# Patient Record
Sex: Male | Born: 1985 | Race: White | Hispanic: No | Marital: Single | Smoking: Never smoker
Health system: Southern US, Community
[De-identification: ages and names within clinical notes are randomized; demographics above are authoritative.]

## PROBLEM LIST (undated history)

## (undated) HISTORY — PX: APPENDECTOMY: SHX54

---

## 2014-08-08 ENCOUNTER — Emergency Department
Admission: EM | Admit: 2014-08-08 | Discharge: 2014-08-08 | Disposition: A | Discharge status: Home / Self Care | Payer: Self-pay | Attending: Emergency Medicine | Admitting: Emergency Medicine

## 2014-08-08 ENCOUNTER — Emergency Department: Payer: Self-pay

## 2014-08-08 ENCOUNTER — Other Ambulatory Visit: Payer: Self-pay

## 2014-08-08 ENCOUNTER — Encounter: Payer: Self-pay | Admitting: Emergency Medicine

## 2014-08-08 DIAGNOSIS — K5900 Constipation, unspecified: Secondary | ICD-10-CM | POA: Insufficient documentation

## 2014-08-08 DIAGNOSIS — R109 Unspecified abdominal pain: Secondary | ICD-10-CM

## 2014-08-08 LAB — COMPREHENSIVE METABOLIC PANEL
ALBUMIN: 3.8 g/dL (ref 3.5–5.0)
ALK PHOS: 78 U/L (ref 38–126)
ALT: 15 U/L — AB (ref 17–63)
ANION GAP: 11 (ref 5–15)
AST: 14 U/L — ABNORMAL LOW (ref 15–41)
BUN: 11 mg/dL (ref 6–20)
CHLORIDE: 98 mmol/L — AB (ref 101–111)
CO2: 25 mmol/L (ref 22–32)
Calcium: 9.1 mg/dL (ref 8.9–10.3)
Creatinine, Ser: 1.04 mg/dL (ref 0.61–1.24)
GFR calc non Af Amer: >60 mL/min (ref >60)
GLUCOSE: 91 mg/dL (ref 65–99)
POTASSIUM: 3.7 mmol/L (ref 3.5–5.1)
SODIUM: 134 mmol/L — AB (ref 135–145)
Total Bilirubin: 0.8 mg/dL (ref 0.3–1.2)
Total Protein: 8.3 g/dL — ABNORMAL HIGH (ref 6.5–8.1)

## 2014-08-08 LAB — CBC WITH DIFFERENTIAL/PLATELET
Basophils Absolute: 0.1 10*3/uL (ref 0–0.1)
Basophils Relative: 1 %
EOS ABS: 0.1 10*3/uL (ref 0–0.7)
Eosinophils Relative: 1 %
HEMATOCRIT: 37.1 % — AB (ref 40.0–52.0)
Hemoglobin: 12.1 g/dL — ABNORMAL LOW (ref 13.0–18.0)
Lymphocytes Relative: 11 %
Lymphs Abs: 1.2 10*3/uL (ref 1.0–3.6)
MCH: 23.9 pg — ABNORMAL LOW (ref 26.0–34.0)
MCHC: 32.6 g/dL (ref 32.0–36.0)
MCV: 73.3 fL — ABNORMAL LOW (ref 80.0–100.0)
Monocytes Absolute: 1.1 10*3/uL — ABNORMAL HIGH (ref 0.2–1.0)
Monocytes Relative: 10 %
NEUTROS PCT: 77 %
Neutro Abs: 8.4 10*3/uL — ABNORMAL HIGH (ref 1.4–6.5)
Platelets: 300 10*3/uL (ref 150–440)
RBC: 5.07 MIL/uL (ref 4.40–5.90)
RDW: 16.3 % — AB (ref 11.5–14.5)
WBC: 10.9 10*3/uL — AB (ref 3.8–10.6)

## 2014-08-08 LAB — URINALYSIS COMPLETE WITH MICROSCOPIC (ARMC ONLY)
BILIRUBIN URINE: NEGATIVE
Bacteria, UA: NONE SEEN
GLUCOSE, UA: NEGATIVE mg/dL
HGB URINE DIPSTICK: NEGATIVE
LEUKOCYTES UA: NEGATIVE
Nitrite: NEGATIVE
PROTEIN: 30 mg/dL — AB
SPECIFIC GRAVITY, URINE: 1.028 (ref 1.005–1.030)
SQUAMOUS EPITHELIAL / LPF: NONE SEEN
pH: 5 (ref 5.0–8.0)

## 2014-08-08 LAB — LIPASE, BLOOD: LIPASE: 12 U/L — AB (ref 22–51)

## 2014-08-08 NOTE — ED Notes (Signed)
Patient reports RLQ abdominal pain. Patient appears well.

## 2014-08-08 NOTE — Discharge Instructions (Signed)
No certain cause for your abdominal pain was found tonight, however your exam and evaluation are reassuring. We discussed the possibility of doing a CT scan tonight and decided to hold off on this study due to the radiation risk versus benefit. We discussed your x-ray shows a significant amount of stool and your pains may be related to constipation. I recommend taking over-the-counter MiraLAX 17 g in 8 ounces of water once to twice per day for the next few days. You may also try over-the-counter fleets enema.  Return to the emergency department for any new or worsening condition including fever, worsening abdominal pain, inability to urinate, vomiting, black or bloody stools, or any other symptoms concerning to you.   Abdominal Pain Many things can cause belly (abdominal) pain. Most times, the belly pain is not dangerous. Many cases of belly pain can be watched and treated at home. HOME CARE   Do not take medicines that help you go poop (laxatives) unless told to by your doctor.  Only take medicine as told by your doctor.  Eat or drink as told by your doctor. Your doctor will tell you if you should be on a special diet. GET HELP IF:  You do not know what is causing your belly pain.  You have belly pain while you are sick to your stomach (nauseous) or have runny poop (diarrhea).  You have pain while you pee or poop.  Your belly pain wakes you up at night.  You have belly pain that gets worse or better when you eat.  You have belly pain that gets worse when you eat fatty foods.  You have a fever. GET HELP RIGHT AWAY IF:   The pain does not go away within 2 hours.  You keep throwing up (vomiting).  The pain changes and is only in the right or left part of the belly.  You have bloody or tarry looking poop. MAKE SURE YOU:   Understand these instructions.  Will watch your condition.  Will get help right away if you are not doing well or get worse. Document Released:  06/16/2007 Document Revised: 01/02/2013 Document Reviewed: 09/06/2012 [facility] Patient Information 2015 [place], [place]. This information is not intended to replace advice given to you by your health care provider. Make sure you discuss any questions you have with your health care provider.  Constipation Constipation is when a person has fewer than three bowel movements a week, has difficulty having a bowel movement, or has stools that are dry, hard, or larger than normal. As people grow older, constipation is more common. If you try to fix constipation with medicines that make you have a bowel movement (laxatives), the problem may get worse. Long-term laxative use may cause the muscles of the colon to become weak. A low-fiber diet, not taking in enough fluids, and taking certain medicines may make constipation worse.  CAUSES   Certain medicines, such as antidepressants, pain medicine, iron supplements, antacids, and water pills.   Certain diseases, such as diabetes, irritable bowel syndrome (IBS), thyroid disease, or depression.   Not drinking enough water.   Not eating enough fiber-rich foods.   Stress or travel.   Lack of physical activity or exercise.   Ignoring the urge to have a bowel movement.   Using laxatives too much.  SIGNS AND SYMPTOMS   Having fewer than three bowel movements a week.   Straining to have a bowel movement.   Having stools that are hard, dry, or larger  than normal.   Feeling full or bloated.   Pain in the lower abdomen.   Not feeling relief after having a bowel movement.  DIAGNOSIS  Your health care provider will take a medical history and perform a physical exam. Further testing may be done for severe constipation. Some tests may include:  A barium enema X-ray to examine your rectum, colon, and, sometimes, your small intestine.   A sigmoidoscopy to examine your lower colon.   A colonoscopy to examine your entire colon. TREATMENT    Treatment will depend on the severity of your constipation and what is causing it. Some dietary treatments include drinking more fluids and eating more fiber-rich foods. Lifestyle treatments may include regular exercise. If these diet and lifestyle recommendations do not help, your health care provider may recommend taking over-the-counter laxative medicines to help you have bowel movements. Prescription medicines may be prescribed if over-the-counter medicines do not work.  HOME CARE INSTRUCTIONS   Eat foods that have a lot of fiber, such as fruits, vegetables, whole grains, and beans.  Limit foods high in fat and processed sugars, such as french fries, hamburgers, cookies, candies, and soda.   A fiber supplement may be added to your diet if you cannot get enough fiber from foods.   Drink enough fluids to keep your urine clear or pale yellow.   Exercise regularly or as directed by your health care provider.   Go to the restroom when you have the urge to go. Do not hold it.   Only take over-the-counter or prescription medicines as directed by your health care provider. Do not take other medicines for constipation without talking to your health care provider first.  [place] IF:   You have bright red blood in your stool.   Your constipation lasts for more than 4 days or gets worse.   You have abdominal or rectal pain.   You have thin, pencil-like stools.   You have unexplained weight loss. MAKE SURE YOU:   Understand these instructions.  Will watch your condition.  Will get help right away if you are not doing well or get worse. Document Released: 09/26/2003 Document Revised: 01/02/2013 Document Reviewed: 10/09/2012 [facility] Patient Information 2015 [place], [place]. This information is not intended to replace advice given to you by your health care provider. Make sure you discuss any questions you have with your health care provider.

## 2014-08-08 NOTE — ED Notes (Signed)
Pt presents to ER alert and in NAD. Pt reports RLQ pain for 2 weeks. Pt denies n/v/d. Pt has not seen PCP for issue.

## 2014-08-08 NOTE — ED Provider Notes (Signed)
[facility] Emergency Department Provider Note   ____________________________________________  Time seen: 8:45 PM I have reviewed the triage vital signs and the triage nursing note.  HISTORY  Chief Complaint Abdominal Pain   Historian Patient and girlfriend  HPI Tony Odonnell is a 29 y.o. male please been having intermittent, waxing and waning right lower quadrant tenderness for about 2 weeks. At times is gone at times it severe and currently it is mild. No trauma or overuse that he knows of. No urinary symptoms. He does have a history of constipation. He's had episodes of diarrhea also. He has a family history of his father having Crohn's disease. This diagnosis has been questioned in the past, however he has not been evaluated with a colonoscopy by a GI evaluation yet. No fever.Prior appendectomy.    History reviewed. No pertinent past medical history.  There are no active problems to display for this patient.   Past Surgical History  Procedure Laterality Date  . Appendectomy      No current outpatient prescriptions on file.  Allergies Review of patient's allergies indicates no known allergies.  History reviewed. No pertinent family history.  Social History History  Substance Use Topics  . Smoking status: Never Smoker   . Smokeless tobacco: Not on file  . Alcohol Use: No    Review of Systems  Constitutional: Negative for fever. Eyes: Negative for visual changes. ENT: Negative for sore throat. Cardiovascular: Negative for chest pain. Respiratory: Negative for shortness of breath. Gastrointestinal: Negative for nausea or vomiting.. Genitourinary: Negative for dysuria. Musculoskeletal: Negative for back pain. Skin: Negative for rash. Neurological: Negative for headaches, focal weakness or numbness. 10 point Review of Systems otherwise negative ____________________________________________   PHYSICAL EXAM:  VITAL SIGNS: ED  Triage Vitals  Enc Vitals Group     BP 08/08/14 1925 145/92 mmHg     Pulse Rate 08/08/14 1925 109     Resp 08/08/14 1925 20     Temp 08/08/14 1925 98.5 F (36.9 C)     Temp Source 08/08/14 1925 Oral     SpO2 08/08/14 1925 99 %     Weight 08/08/14 1925 245 lb 14.4 oz (111.54 kg)     Height 08/08/14 1925 6\' 1"  (1.854 m)     Head Cir --      Peak Flow --      Pain Score 08/08/14 1926 3     Pain Loc --      Pain Edu? --      Excl. in [place]? --      Constitutional: Alert and oriented. Well appearing and in no distress. Eyes: Conjunctivae are normal. PERRL. Normal extraocular movements. ENT   Head: Normocephalic and atraumatic.   Nose: No congestion/rhinnorhea.   Mouth/Throat: Mucous membranes are moist.   Neck: No stridor. Cardiovascular/Chest: Normal rate, regular rhythm.  No murmurs, rubs, or gallops. Respiratory: Normal respiratory effort without tachypnea nor retractions. Breath sounds are clear and equal bilaterally. No wheezes/rales/rhonchi. Gastrointestinal: Soft. No distention, no guarding, no rebound. Moderate tenderness in the right-sided abdomen lower greater than upper. No focal right upper quadrant tenderness. No left sided tenderness.  Genitourinary/rectal:Deferred Musculoskeletal: Nontender with normal range of motion in all extremities. No joint effusions.  No lower extremity tenderness nor edema. Neurologic:  Normal speech and language. No gross or focal neurologic deficits are appreciated. Skin:  Skin is warm, dry and intact. No rash noted. Psychiatric: Mood and affect are normal. Speech and behavior are normal. Patient exhibits appropriate  insight and judgment.  ____________________________________________   EKG I, Lisa Roca, MD, the attending physician have personally viewed and interpreted all ECGs.  Sinus tachycardia. 108 bpm. Narrow QRS. Normal axis. Normal ST and T-wave. ____________________________________________  LABS (pertinent  positives/negatives)  Metabolic panel significant for sodium 134, chloride 98. LFTs within normal limits White blood cell count 10.9, hemoglobin 12.1 Lipase 12  ____________________________________________  RADIOLOGY All Xrays were viewed by me. Imaging interpreted by Radiologist.  Abdomen x-ray: Moderate: Large stool burden, and no air-fluid levels.  IMPRESSION: Question right ureterolithiasis, with a triangular 4 mm calcification near the right L4 transverse process.  Renal ultrasound: Normal renal ultrasound  __________________________________________  PROCEDURES  Procedure(s) performed: None Critical Care performed: None  ____________________________________________   ED COURSE / ASSESSMENT AND PLAN  CONSULTATIONS: None  Pertinent labs & imaging results that were available during my care of the patient were reviewed by me and considered in my medical decision making (see chart for details).   Patient's symptoms are in the right lower quadrant, he has already had an appendectomy. Symptoms have been waxing and waning and considering the possibility for kidney stone and I'm awaiting urinalysis and renal ultrasound this point in time. I discussed risk and benefit of CT scan with the patient and he wants to wait to make this decision until after an x-ray and his renal ultrasound.  We discussed his x-ray shows a significant stool burden without air-fluid levels or signs of obstruction. We discussed possible stones seen on x-ray, however it was 4 mm and would likely pass on its own, and I feel kidney stone is unlikely given the fact that the pain has been there for a couple weeks and the size is so small and the patient has had no urinary symptoms, and there is no blood in the urine, and ultrasound of the kidneys are normal.  I discussed with the patient the consideration of doing a CT scan given the right-sided abdominal pain and the elevated white blood cell count, however he  is hesitant to do this, and I think that is reasonable to treat possible constipation. The patient is reasonable and understands follow-up precautions.  Patient / Family / Caregiver informed of clinical course, medical decision-making process, and agree with plan.   I discussed return precautions, follow-up instructions, and discharged instructions with patient and/or family.  ___________________________________________   FINAL CLINICAL IMPRESSION(S) / ED DIAGNOSES   Final diagnoses:  Right sided abdominal pain  Constipation, unspecified constipation type    FOLLOW UP  Referred to: Primary care physician and gastroenterology   Lisa Roca, MD 08/08/14 2151

## 2014-08-24 ENCOUNTER — Emergency Department: Payer: Self-pay

## 2014-08-24 ENCOUNTER — Encounter: Payer: Self-pay | Admitting: Emergency Medicine

## 2014-08-24 ENCOUNTER — Inpatient Hospital Stay
Admission: EM | Admit: 2014-08-24 | Discharge: 2014-08-29 | DRG: 386 | Disposition: A | Discharge status: Home / Self Care | Payer: Self-pay | Attending: Specialist | Admitting: Specialist

## 2014-08-24 DIAGNOSIS — D72829 Elevated white blood cell count, unspecified: Secondary | ICD-10-CM | POA: Diagnosis present

## 2014-08-24 DIAGNOSIS — K5900 Constipation, unspecified: Secondary | ICD-10-CM | POA: Diagnosis present

## 2014-08-24 DIAGNOSIS — K632 Fistula of intestine: Secondary | ICD-10-CM | POA: Diagnosis present

## 2014-08-24 DIAGNOSIS — R109 Unspecified abdominal pain: Secondary | ICD-10-CM

## 2014-08-24 DIAGNOSIS — E871 Hypo-osmolality and hyponatremia: Secondary | ICD-10-CM | POA: Diagnosis present

## 2014-08-24 DIAGNOSIS — D638 Anemia in other chronic diseases classified elsewhere: Secondary | ICD-10-CM | POA: Diagnosis present

## 2014-08-24 DIAGNOSIS — Z9049 Acquired absence of other specified parts of digestive tract: Secondary | ICD-10-CM | POA: Diagnosis present

## 2014-08-24 DIAGNOSIS — D509 Iron deficiency anemia, unspecified: Secondary | ICD-10-CM | POA: Diagnosis present

## 2014-08-24 DIAGNOSIS — K630 Abscess of intestine: Secondary | ICD-10-CM | POA: Diagnosis present

## 2014-08-24 DIAGNOSIS — K50813 Crohn's disease of both small and large intestine with fistula: Principal | ICD-10-CM | POA: Insufficient documentation

## 2014-08-24 DIAGNOSIS — E860 Dehydration: Secondary | ICD-10-CM | POA: Diagnosis present

## 2014-08-24 LAB — COMPREHENSIVE METABOLIC PANEL
ALT: 22 U/L (ref 17–63)
AST: 17 U/L (ref 15–41)
Albumin: 3.7 g/dL (ref 3.5–5.0)
Alkaline Phosphatase: 115 U/L (ref 38–126)
Anion gap: 9 (ref 5–15)
BILIRUBIN TOTAL: 0.8 mg/dL (ref 0.3–1.2)
BUN: 11 mg/dL (ref 6–20)
CALCIUM: 9.3 mg/dL (ref 8.9–10.3)
CHLORIDE: 103 mmol/L (ref 101–111)
CO2: 26 mmol/L (ref 22–32)
Creatinine, Ser: 0.98 mg/dL (ref 0.61–1.24)
GFR calc Af Amer: >60 mL/min (ref >60)
GFR calc non Af Amer: >60 mL/min (ref >60)
Glucose, Bld: 100 mg/dL — ABNORMAL HIGH (ref 65–99)
Potassium: 4.2 mmol/L (ref 3.5–5.1)
SODIUM: 138 mmol/L (ref 135–145)
TOTAL PROTEIN: 8.4 g/dL — AB (ref 6.5–8.1)

## 2014-08-24 LAB — CBC
HEMATOCRIT: 39.5 % — AB (ref 40.0–52.0)
Hemoglobin: 12.2 g/dL — ABNORMAL LOW (ref 13.0–18.0)
MCH: 22.8 pg — ABNORMAL LOW (ref 26.0–34.0)
MCHC: 31 g/dL — AB (ref 32.0–36.0)
MCV: 73.5 fL — AB (ref 80.0–100.0)
PLATELETS: 419 10*3/uL (ref 150–440)
RBC: 5.37 MIL/uL (ref 4.40–5.90)
RDW: 16.7 % — AB (ref 11.5–14.5)
WBC: 9.4 10*3/uL (ref 3.8–10.6)

## 2014-08-24 LAB — URINALYSIS COMPLETE WITH MICROSCOPIC (ARMC ONLY)
Bilirubin Urine: NEGATIVE
Glucose, UA: NEGATIVE mg/dL
Hgb urine dipstick: NEGATIVE
LEUKOCYTES UA: NEGATIVE
NITRITE: NEGATIVE
PROTEIN: 30 mg/dL — AB
Specific Gravity, Urine: 1.031 — ABNORMAL HIGH (ref 1.005–1.030)
pH: 5 (ref 5.0–8.0)

## 2014-08-24 MED ORDER — ONDANSETRON HCL 4 MG/2ML IJ SOLN
4.0000 mg | Freq: Once | INTRAMUSCULAR | Status: AC
Start: 2014-08-24 — End: 2014-08-24
  Administered 2014-08-24: 4 mg via INTRAVENOUS
  Filled 2014-08-24: qty 2

## 2014-08-24 MED ORDER — SODIUM CHLORIDE 0.9 % IV BOLUS (SEPSIS)
1000.0000 mL | Freq: Once | INTRAVENOUS | Status: AC
  Administered 2014-08-24: 1000 mL via INTRAVENOUS

## 2014-08-24 MED ORDER — PIPERACILLIN-TAZOBACTAM 3.375 G IVPB
3.3750 g | Freq: Once | INTRAVENOUS | Status: AC
  Administered 2014-08-24: 3.375 g via INTRAVENOUS
  Filled 2014-08-24: qty 50

## 2014-08-24 MED ORDER — ACETAMINOPHEN 325 MG PO TABS: 650.0000 mg | ORAL_TABLET | Freq: Four times a day (QID) | ORAL | Status: DC | PRN

## 2014-08-24 MED ORDER — PIPERACILLIN-TAZOBACTAM 3.375 G IVPB
3.3750 g | Freq: Three times a day (TID) | INTRAVENOUS | Status: DC
  Administered 2014-08-25 – 2014-08-29 (×14): 3.375 g via INTRAVENOUS
  Filled 2014-08-24 (×17): qty 50

## 2014-08-24 MED ORDER — IOHEXOL 240 MG/ML SOLN
25.0000 mL | Freq: Once | INTRAMUSCULAR | Status: AC | PRN
  Administered 2014-08-24: 25 mL via ORAL

## 2014-08-24 MED ORDER — SODIUM CHLORIDE 0.9 % IV SOLN
INTRAVENOUS | Status: DC
  Administered 2014-08-24 – 2014-08-29 (×8): via INTRAVENOUS

## 2014-08-24 MED ORDER — HYDROMORPHONE HCL 1 MG/ML IJ SOLN
1.0000 mg | INTRAMUSCULAR | Status: DC | PRN
  Administered 2014-08-24 – 2014-08-29 (×13): 1 mg via INTRAVENOUS
  Filled 2014-08-24 (×13): qty 1

## 2014-08-24 MED ORDER — IOHEXOL 350 MG/ML SOLN
100.0000 mL | Freq: Once | INTRAVENOUS | Status: AC | PRN
  Administered 2014-08-24: 100 mL via INTRAVENOUS

## 2014-08-24 MED ORDER — PIPERACILLIN SOD-TAZOBACTAM SO 3.375 (3-0.375) G IV SOLR
INTRAVENOUS | Status: AC
  Filled 2014-08-24: qty 3.38

## 2014-08-24 MED ORDER — HYDROMORPHONE HCL 1 MG/ML IJ SOLN
1.0000 mg | Freq: Once | INTRAMUSCULAR | Status: AC
  Administered 2014-08-24: 1 mg via INTRAVENOUS
  Filled 2014-08-24: qty 1

## 2014-08-24 MED ORDER — ACETAMINOPHEN 650 MG RE SUPP: 650.0000 mg | Freq: Four times a day (QID) | RECTAL | Status: DC | PRN

## 2014-08-24 NOTE — ED Provider Notes (Addendum)
[facility]  I accepted care from Dr. Corky Downs at shift change 3 PM   ____________________________________________    RADIOLOGY All xrays were viewed by me. Imaging interpreted by radiologist. I discussed the CT results with the radiologist  CT abdomen pelvis with contrast:  Stomach/Bowel: The appearance of the stomach is normal. No pathologic dilatation of small bowel or colon. Status post appendectomy. In the right side of the abdomen there are multiple loops of bowel which appear intimately tethered to one another, with apparent inter- communications between loops of small bowel and adjacent loops of both small bowel and colon (i.e., both enteroenteric fistulae and enterocolonic fistulae). 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 In the more proximal ileum, best appreciated on image 60 of series 2 there is profound mural thickening. There appear to be a multiple small extra luminal collections of fluid which are rim enhancing, with extensive surrounding inflammation, and extension to the overlying abdominal wall musculature in the right side of the abdomen anterior to the ascending colon, concerning for combination of phlegmon and multiple tiny abscesses. On coronal images (image 61 of series 5), the largest of these small rim enhancing fluid collections measures approximately 1.3 x 1.9 cm and appears to be extending into the overlying transversus abdominis Musculature.   IMPRESSION: 1. Spectrum of findings in the right side of the abdomen, as detailed above, which almost certainly represents advanced Crohn's disease. 2. Status post appendectomy.  ____________________________________________   PROCEDURES  Procedure(s) performed: None  Critical Care performed: None  ____________________________________________   INITIAL  IMPRESSION / ASSESSMENT AND PLAN / ED COURSE  CONSULTATIONS: Phone consultation with gastroenterology Dr. Vira Agar. Face-to-face consultation with hospitalist for admission. Dr. Pat Patrick, general surgery face-to-face for consultation.  Pertinent labs & imaging results that were available during my care of the patient were reviewed by me and considered in my medical decision making (see chart for details).  I discussed CT scan findings showing advanced Crohn's disease with multiple fistulae and phlegmon with the radiologist and then with the patient.. Discussed the case with Dr. Vira Agar with GI. Patient will initiate treatment here in the hospital during admission. Patient to be admitted by the hospitalist.  ----------------------------------------- 5:35 PM on 08/24/2014 -----------------------------------------  Per hospitalist Dr. Bridgett Larsson request, I have discussed the case with Dr. Pat Patrick with general surgery, and requested consultation. Dr. Pat Patrick will see the patient prior to Hospital is admission. Suspect medical management with IV antibiotics initially as the patient has mild localized peritonitis, without any systemic symptoms of elevated white blood cell count, or fever.  ----------------------------------------- 6:59 PM on 08/24/2014 -----------------------------------------  Hospitalist Dr. Edwina Barth talked with Dr. Vira Agar of GI and decided patient would be treated medically first and given the fact that clinically he does not show fever, white blood cell count elevation, or diffuse peritonitis. Dr. Edwina Barth will admit to the hospitalist. Dr. Pat Patrick has not seen the patient yet in the ED.   Patient / Family / Caregiver informed of clinical course, medical decision-making process, and agree with plan.    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

## 2014-08-24 NOTE — ED Provider Notes (Signed)
[facility] Emergency Department Provider Note  ____________________________________________  Time seen: 1 PM  I have reviewed the triage vital signs and the nursing notes.   HISTORY  Chief Complaint Abdominal Pain    HPI Shepherd Tony Odonnell is a 29 y.o. male who presents with abdominal pain. He reports 2 weeks of right-sided abdominal pain to be intermittent. He was seen in our emergency department had an x-ray and ultrasound of his kidneys which showed no significant issues except constipation. He has taken stool softeners for the last 2 weeks and it is helped somewhat but the pain continues. He denies fevers chills. No nausea no vomiting.     History reviewed. No pertinent past medical history.  There are no active problems to display for this patient.   Past Surgical History  Procedure Laterality Date  . Appendectomy      No current outpatient prescriptions on file.  Allergies Other  No family history on file.  Social History Social History  Substance Use Topics  . Smoking status: Never Smoker   . Smokeless tobacco: None  . Alcohol Use: No    Review of Systems  Constitutional: Negative for fever. Eyes: Negative for visual changes. ENT: Negative for sore throat Cardiovascular: Negative for chest pain. Respiratory: Negative for shortness of breath. Gastrointestinal: Negative for  vomiting and diarrhea. Genitourinary: Negative for dysuria. Musculoskeletal: Negative for back pain. Skin: Negative for rash. Neurological: Negative for headaches  Psychiatric: No anxiety    ____________________________________________   PHYSICAL EXAM:  VITAL SIGNS: ED Triage Vitals  Enc Vitals Group     BP 08/24/14 1119 135/79 mmHg     Pulse Rate 08/24/14 1119 111     Resp 08/24/14 1119 18     Temp 08/24/14 1119 98.4 F (36.9 C)     Temp Source 08/24/14 1119 Oral     SpO2 08/24/14 1119 100 %     Weight 08/24/14 1119 245 lb (111.131 kg)   Height 08/24/14 1119 6\' 2"  (1.88 m)     Head Cir --      Peak Flow --      Pain Score 08/24/14 1120 6     Pain Loc --      Pain Edu? --      Excl. in [place]? --      Constitutional: Alert and oriented. Well appearing and in no distress. Eyes: Conjunctivae are normal.  ENT   Head: Normocephalic and atraumatic.   Mouth/Throat: Mucous membranes are moist. Cardiovascular: Normal rate, regular rhythm. Normal and symmetric distal pulses are present in all extremities. No murmurs, rubs, or gallops. Respiratory: Normal respiratory effort without tachypnea nor retractions. Breath sounds are clear and equal bilaterally.  Gastrointestinal: Significant right upper quadrant tenderness to palpation. Positive Murphy's sign. No distention. There is no CVA tenderness. Genitourinary: deferred Musculoskeletal: Nontender with normal range of motion in all extremities. No lower extremity tenderness nor edema. Neurologic:  Normal speech and language. No gross focal neurologic deficits are appreciated. Skin:  Skin is warm, dry and intact. No rash noted. Psychiatric: Mood and affect are normal. Patient exhibits appropriate insight and judgment.  ____________________________________________    LABS (pertinent positives/negatives)  Labs Reviewed  COMPREHENSIVE METABOLIC PANEL - Abnormal; Notable for the following:    Glucose, Bld 100 (*)    Total Protein 8.4 (*)    All other components within normal limits  CBC - Abnormal; Notable for the following:    Hemoglobin 12.2 (*)    HCT 39.5 (*)  MCV 73.5 (*)    MCH 22.8 (*)    MCHC 31.0 (*)    RDW 16.7 (*)    All other components within normal limits  URINALYSIS COMPLETEWITH MICROSCOPIC ([facility] ONLY) - Abnormal; Notable for the following:    Color, Urine AMBER (*)    APPearance CLEAR (*)    Ketones, ur TRACE (*)    Specific Gravity, Urine 1.031 (*)    Protein, ur 30 (*)    Bacteria, UA RARE (*)    Squamous Epithelial / LPF 0-5 (*)    All other  components within normal limits    ____________________________________________   EKG  None  ____________________________________________    RADIOLOGY I have personally reviewed any xrays that were ordered on this patient: Ultrasound right upper quadrant is normal.  ____________________________________________   PROCEDURES  Procedure(s) performed: none  Critical Care performed: none  ____________________________________________   INITIAL IMPRESSION / ASSESSMENT AND PLAN / ED COURSE  Pertinent labs & imaging results that were available during my care of the patient were reviewed by me and considered in my medical decision making (see chart for details).  Patient with significant right upper quadrant tenderness to palpation. I will obtain ultrasound of the gallbladder to rule out cholelithiasis. If the ultrasound is normal patient may require CT scan.  ----------------------------------------- 2:53 PM on 08/24/2014 -----------------------------------------  We will obtain CT abdomen and pelvis and I'll sign out the case to Dr. Reita Cliche. If normal CT patient will go home with follow-up with GI  ____________________________________________   FINAL CLINICAL IMPRESSION(S) / ED DIAGNOSES  Final diagnoses:  Abdominal pain     Lavonia Drafts, MD 08/24/14 1454

## 2014-08-24 NOTE — ED Notes (Signed)
Patient to ER for continued RLQ abdominal pain. Patient states he was here a few weeks ago for the same. Patient had ultrasound that was negative. Was told he had constipation and was given laxatives. States he was told if pain did not go away to come back to have CT. Patient

## 2014-08-24 NOTE — Consult Note (Signed)
Surgical Consultation  08/24/2014  Tony Odonnell is an 29 y.o. male.   CC: Right lower quadrant pain  HPI: This patient with several days of right lower quadrant pain it has been gradually worsening. He denies nausea vomiting fevers or chills and has had normal bowel movements without diarrhea melena or hematochezia.  Of note the patient had this happen at least once before. He was cared for at [facility] 7 years ago when he was approximately 29 years old and he had an appendectomy at that time because he states that his appendix possibly ruptured a year before his appendectomy. And in fact he had drainage of an abscess followed by an interval appendectomy and was told that he may have Crohn's disease at that time his father also has Crohn's disease which is managed well with medications and does not have an ileostomy. Patient has never been treated for Crohn's disease other than the appendectomy he's never been on medications for Crohn's disease and denies steroidal use. He has had no follow-up with a GI physician between his appendectomy and today. He was seen by Dr. Vira Agar for the first time today.  History reviewed. No pertinent past medical history.  Past Surgical History  Procedure Laterality Date  . Appendectomy      No family history on file.  Social History:  reports that he has never smoked. He does not have any smokeless tobacco history on file. He reports that he does not drink alcohol. His drug history is not on file.  Allergies:  Allergies  Allergen Reactions  . Other Anaphylaxis    Mushrooms    Medications reviewed.   Review of Systems:   Review of Systems  Constitutional: Negative.  Negative for fever, chills and weight loss.  HENT: Negative.   Eyes: Negative.   Respiratory: Negative.   Cardiovascular: Negative.   Gastrointestinal: Positive for abdominal pain. Negative for heartburn, nausea, vomiting, diarrhea, constipation, blood in stool and melena.   Genitourinary: Negative.   Musculoskeletal: Negative.   Neurological: Negative.   Endo/Heme/Allergies: Negative.   Psychiatric/Behavioral: Negative.      Physical Exam:  BP 116/77 mmHg  Pulse 95  Temp(Src) 98.4 F (36.9 C) (Oral)  Resp 18  Ht '6\' 2"'  (1.88 m)  Wt 245 lb (111.131 kg)  BMI 31.44 kg/m2  SpO2 98%  Physical Exam  Constitutional: He is oriented to person, place, and time and well-developed, well-nourished, and in no distress. No distress.  HENT:  Head: Normocephalic and atraumatic.  Eyes: Pupils are equal, round, and reactive to light. No scleral icterus.  Neck: Normal range of motion. Neck supple.  Cardiovascular: Normal rate, regular rhythm and normal heart sounds.   Pulmonary/Chest: Effort normal and breath sounds normal. No respiratory distress. He has no wheezes. He has no rales.  Abdominal: Soft. He exhibits no distension. There is tenderness. There is no rebound and no guarding.  Minimal right lower quadrant tenderness without peritoneal signs no guarding no rebound no percussion tenderness and no mass  Musculoskeletal: Normal range of motion.  Lymphadenopathy:    He has no cervical adenopathy.  Neurological: He is alert and oriented to person, place, and time.  Skin: Skin is warm and dry. No rash noted. No erythema.  Psychiatric: Mood, affect and judgment normal.      Results for orders placed or performed during the hospital encounter of 08/24/14 (from the past 48 hour(s))  Comprehensive metabolic panel     Status: Abnormal   Collection Time: 08/24/14 11:27  AM  Result Value Ref Range   Sodium 138 135 - 145 mmol/L   Potassium 4.2 3.5 - 5.1 mmol/L   Chloride 103 101 - 111 mmol/L   CO2 26 22 - 32 mmol/L   Glucose, Bld 100 (H) 65 - 99 mg/dL   BUN 11 6 - 20 mg/dL   Creatinine, Ser 0.98 0.61 - 1.24 mg/dL   Calcium 9.3 8.9 - 10.3 mg/dL   Total Protein 8.4 (H) 6.5 - 8.1 g/dL   Albumin 3.7 3.5 - 5.0 g/dL   AST 17 15 - 41 U/L   ALT 22 17 - 63 U/L    Alkaline Phosphatase 115 38 - 126 U/L   Total Bilirubin 0.8 0.3 - 1.2 mg/dL   GFR calc non Af Amer >60 >60 mL/min   GFR calc Af Amer >60 >60 mL/min    Comment: (NOTE) The eGFR has been calculated using the CKD EPI equation. This calculation has not been validated in all clinical situations. eGFR's persistently <60 mL/min signify possible Chronic Kidney Disease.    Anion gap 9 5 - 15  CBC     Status: Abnormal   Collection Time: 08/24/14 11:27 AM  Result Value Ref Range   WBC 9.4 3.8 - 10.6 K/uL   RBC 5.37 4.40 - 5.90 MIL/uL   Hemoglobin 12.2 (L) 13.0 - 18.0 g/dL   HCT 39.5 (L) 40.0 - 52.0 %   MCV 73.5 (L) 80.0 - 100.0 fL   MCH 22.8 (L) 26.0 - 34.0 pg   MCHC 31.0 (L) 32.0 - 36.0 g/dL   RDW 16.7 (H) 11.5 - 14.5 %   Platelets 419 150 - 440 K/uL  Urinalysis complete, with microscopic ([facility] only)     Status: Abnormal   Collection Time: 08/24/14 11:27 AM  Result Value Ref Range   Color, Urine AMBER (A) YELLOW   APPearance CLEAR (A) CLEAR   Glucose, UA NEGATIVE NEGATIVE mg/dL   Bilirubin Urine NEGATIVE NEGATIVE   Ketones, ur TRACE (A) NEGATIVE mg/dL   Specific Gravity, Urine 1.031 (H) 1.005 - 1.030   Hgb urine dipstick NEGATIVE NEGATIVE   pH 5.0 5.0 - 8.0   Protein, ur 30 (A) NEGATIVE mg/dL   Nitrite NEGATIVE NEGATIVE   Leukocytes, UA NEGATIVE NEGATIVE   RBC / HPF 0-5 0 - 5 RBC/hpf   WBC, UA 0-5 0 - 5 WBC/hpf   Bacteria, UA RARE (A) NONE SEEN   Squamous Epithelial / LPF 0-5 (A) NONE SEEN   Mucous PRESENT    Ct Abdomen Pelvis W Contrast  08/24/2014   CLINICAL DATA:  29 year old male with 2 week history of right-sided abdominal pain intermittently. History of prior appendectomy.  EXAM: CT ABDOMEN AND PELVIS WITH CONTRAST  TECHNIQUE: Multidetector CT imaging of the abdomen and pelvis was performed using the standard protocol following bolus administration of intravenous contrast.  CONTRAST:  179m OMNIPAQUE IOHEXOL 350 MG/ML SOLN  COMPARISON:  CT the abdomen and pelvis 07/13/2009.   FINDINGS: Lower chest:  Unremarkable.  Hepatobiliary: No cystic or solid hepatic lesions. No intra or extrahepatic biliary ductal dilatation. Gallbladder is normal in appearance.  Pancreas: No pancreatic mass. No pancreatic ductal dilatation. No pancreatic or peripancreatic fluid or inflammatory changes.  Spleen: Unremarkable.  Adrenals/Urinary Tract: Bilateral adrenal glands and the right kidney are normal in appearance. Sub cm low-attenuation lesion in the lower pole of the left kidney is too small to characterize, but statistically likely a small cyst. No hydroureteronephrosis. Urinary bladder is normal in appearance.  Stomach/Bowel: The appearance of the stomach is normal. No pathologic dilatation of small bowel or colon. Status post appendectomy. In the right side of the abdomen there are multiple loops of bowel which appear intimately tethered to one another, with apparent inter- communications between loops of small bowel and adjacent loops of both small bowel and colon (i.e., both enteroenteric fistulae and enterocolonic fistulae). 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 In the more proximal ileum, best appreciated on image 60 of series 2 there is profound mural thickening. There appear to be a multiple small extra luminal collections of fluid which are rim enhancing, with extensive surrounding inflammation, and extension to the overlying abdominal wall musculature in the right side of the abdomen anterior to the ascending colon, concerning for combination of phlegmon and multiple tiny abscesses. On coronal images (image 61 of series 5), the largest of these small rim enhancing fluid collections measures approximately 1.3 x 1.9 cm and appears to be extending into the overlying transversus abdominis musculature.  Vascular/Lymphatic: No significant atherosclerotic disease, aneurysm or  dissection identified in the abdominal or pelvic vasculature. Several borderline enlarged ileocolic lymph nodes are noted, presumably reactive.  Reproductive: Prostate gland and seminal vesicles are unremarkable in appearance.  Other: No significant volume of ascites.  No pneumoperitoneum.  Musculoskeletal: There are no aggressive appearing lytic or blastic lesions noted in the visualized portions of the skeleton.  IMPRESSION: 1. Spectrum of findings in the right side of the abdomen, as detailed above, which almost certainly represents advanced Crohn's disease. 2. Status post appendectomy. These results were called by telephone at the time of interpretation on 08/24/2014 at 4:06 pm to Dr. Reita Cliche, who verbally acknowledged these results.   Electronically Signed   By: Vinnie Langton M.D.   On: 08/24/2014 16:08   US Abdomen Limited Ruq  08/24/2014   CLINICAL DATA:  Right upper quadrant pain  EXAM: US ABDOMEN LIMITED - RIGHT UPPER QUADRANT  COMPARISON:  None.  FINDINGS: Gallbladder:  No gallstones or wall thickening visualized. No sonographic Murphy sign noted.  Common bile duct:  Diameter: 4.7 mm in diameter within normal limits.  Liver:  No focal lesion identified. Within normal limits in parenchymal echogenicity.  IMPRESSION: Unremarkable right upper quadrant ultrasound.   Electronically Signed   By: Lahoma Crocker M.D.   On: 08/24/2014 14:27    Assessment/Plan:  CT scan is personally reviewed and the patient's history and family history and physical exam are consistent with Crohn's disease. He likely has interloop abscesses but there are no drainable areas. At this point I believe that antibiotic therapy is the first line in order in this patient. As he improves he could be treated with typical Crohn's medications at Dr. Percell Boston discretion. I related sterile aids are not indicated at this point and may be detrimental. The surgical team will follow this patient while he is in the hospital. I discussed with the  patient his family that surgery at this point could result in a temporary or permanent ileostomy and certainly would result in bowel loss which could be a big problem in a patient with Crohn's disease. There were in agreement with this plan.  Florene Glen, MD, FACS

## 2014-08-24 NOTE — H&P (Signed)
Tony Odonnell is an 29 y.o. male.   Chief Complaint: Abdominal pain HPI: Started 4 weeks ago with right sided abdominal pain. Seen in ED 2 weeks ago and had plain films and sent home with pain meds and laxatives. Felt better until about a week ago. 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  History reviewed. No pertinent past medical history.No known medical problems  Past Surgical History  Procedure Laterality Date  . Appendectomy      No family history on file.Crohns disease Social History:  reports that he has never smoked. He does not have any smokeless tobacco history on file. He reports that he does not drink alcohol. His drug history is not on file.  Does not smoke Drinks EtOH occasionally  Allergies:  Allergies  Allergen Reactions  . Other Anaphylaxis    Mushrooms     (Not in a hospital admission)  Results for orders placed or performed during the hospital encounter of 08/24/14 (from the past 48 hour(s))  Comprehensive metabolic panel     Status: Abnormal   Collection Time: 08/24/14 11:27 AM  Result Value Ref Range   Sodium 138 135 - 145 mmol/L   Potassium 4.2 3.5 - 5.1 mmol/L   Chloride 103 101 - 111 mmol/L   CO2 26 22 - 32 mmol/L   Glucose, Bld 100 (H) 65 - 99 mg/dL   BUN 11 6 - 20 mg/dL   Creatinine, Ser 0.98 0.61 - 1.24 mg/dL   Calcium 9.3 8.9 - 10.3 mg/dL   Total Protein 8.4 (H) 6.5 - 8.1 g/dL   Albumin 3.7 3.5 - 5.0 g/dL   AST 17 15 - 41 U/L   ALT 22 17 - 63 U/L   Alkaline Phosphatase 115 38 - 126 U/L   Total Bilirubin 0.8 0.3 - 1.2 mg/dL   GFR calc non Af Amer >60 >60 mL/min   GFR calc Af Amer >60 >60 mL/min    Comment: (NOTE) The eGFR has been calculated using the CKD EPI equation. This calculation has not been validated in all clinical situations. eGFR's persistently <60 mL/min signify possible Chronic Kidney Disease.    Anion gap 9 5 - 15  CBC     Status: Abnormal   Collection Time: 08/24/14 11:27 AM  Result Value Ref Range    WBC 9.4 3.8 - 10.6 K/uL   RBC 5.37 4.40 - 5.90 MIL/uL   Hemoglobin 12.2 (L) 13.0 - 18.0 g/dL   HCT 39.5 (L) 40.0 - 52.0 %   MCV 73.5 (L) 80.0 - 100.0 fL   MCH 22.8 (L) 26.0 - 34.0 pg   MCHC 31.0 (L) 32.0 - 36.0 g/dL   RDW 16.7 (H) 11.5 - 14.5 %   Platelets 419 150 - 440 K/uL  Urinalysis complete, with microscopic ([facility] only)     Status: Abnormal   Collection Time: 08/24/14 11:27 AM  Result Value Ref Range   Color, Urine AMBER (A) YELLOW   APPearance CLEAR (A) CLEAR   Glucose, UA NEGATIVE NEGATIVE mg/dL   Bilirubin Urine NEGATIVE NEGATIVE   Ketones, ur TRACE (A) NEGATIVE mg/dL   Specific Gravity, Urine 1.031 (H) 1.005 - 1.030   Hgb urine dipstick NEGATIVE NEGATIVE   pH 5.0 5.0 - 8.0   Protein, ur 30 (A) NEGATIVE mg/dL   Nitrite NEGATIVE NEGATIVE   Leukocytes, UA NEGATIVE NEGATIVE   RBC / HPF 0-5 0 - 5 RBC/hpf   WBC, UA 0-5 0 - 5 WBC/hpf   Bacteria, UA RARE (  A) NONE SEEN   Squamous Epithelial / LPF 0-5 (A) NONE SEEN   Mucous PRESENT    Ct Abdomen Pelvis W Contrast  08/24/2014   CLINICAL DATA:  29 year old male with 2 week history of right-sided abdominal pain intermittently. History of prior appendectomy.  EXAM: CT ABDOMEN AND PELVIS WITH CONTRAST  TECHNIQUE: Multidetector CT imaging of the abdomen and pelvis was performed using the standard protocol following bolus administration of intravenous contrast.  CONTRAST:  162m OMNIPAQUE IOHEXOL 350 MG/ML SOLN  COMPARISON:  CT the abdomen and pelvis 07/13/2009.  FINDINGS: Lower chest:  Unremarkable.  Hepatobiliary: No cystic or solid hepatic lesions. No intra or extrahepatic biliary ductal dilatation. Gallbladder is normal in appearance.  Pancreas: No pancreatic mass. No pancreatic ductal dilatation. No pancreatic or peripancreatic fluid or inflammatory changes.  Spleen: Unremarkable.  Adrenals/Urinary Tract: Bilateral adrenal glands and the right kidney are normal in appearance. Sub cm low-attenuation lesion in the lower pole of the left  kidney is too small to characterize, but statistically likely a small cyst. No hydroureteronephrosis. Urinary bladder is normal in appearance.  Stomach/Bowel: The appearance of the stomach is normal. No pathologic dilatation of small bowel or colon. Status post appendectomy. In the right side of the abdomen there are multiple loops of bowel which appear intimately tethered to one another, with apparent inter- communications between loops of small bowel and adjacent loops of both small bowel and colon (i.e., both enteroenteric fistulae and enterocolonic fistulae). 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 In the more proximal ileum, best appreciated on image 60 of series 2 there is profound mural thickening. There appear to be a multiple small extra luminal collections of fluid which are rim enhancing, with extensive surrounding inflammation, and extension to the overlying abdominal wall musculature in the right side of the abdomen anterior to the ascending colon, concerning for combination of phlegmon and multiple tiny abscesses. On coronal images (image 61 of series 5), the largest of these small rim enhancing fluid collections measures approximately 1.3 x 1.9 cm and appears to be extending into the overlying transversus abdominis musculature.  Vascular/Lymphatic: No significant atherosclerotic disease, aneurysm or dissection identified in the abdominal or pelvic vasculature. Several borderline enlarged ileocolic lymph nodes are noted, presumably reactive.  Reproductive: Prostate gland and seminal vesicles are unremarkable in appearance.  Other: No significant volume of ascites.  No pneumoperitoneum.  Musculoskeletal: There are no aggressive appearing lytic or blastic lesions noted in the visualized portions of the skeleton.  IMPRESSION: 1. Spectrum of findings in the right side of the abdomen,  as detailed above, which almost certainly represents advanced Crohn's disease. 2. Status post appendectomy. These results were called by telephone at the time of interpretation on 08/24/2014 at 4:06 pm to Dr. LReita Cliche who verbally acknowledged these results.   Electronically Signed   By: DVinnie LangtonM.D.   On: 08/24/2014 16:08   [place]domen Limited Ruq  08/24/2014   CLINICAL DATA:  Right upper quadrant pain  EXAM: [place]DOMEN LIMITED - RIGHT UPPER QUADRANT  COMPARISON:  None.  FINDINGS: Gallbladder:  No gallstones or wall thickening visualized. No sonographic Murphy sign noted.  Common bile duct:  Diameter: 4.7 mm in diameter within normal limits.  Liver:  No focal lesion identified. Within normal limits in parenchymal echogenicity.  IMPRESSION: Unremarkable right upper quadrant ultrasound.   Electronically Signed   By: LJulien Girt  Pop M.D.   On: 08/24/2014 14:27    Review of Systems  Constitutional: Negative for fever and chills.  HENT: Negative for hearing loss.   Eyes: Negative for blurred vision.  Respiratory: Negative for shortness of breath.   Cardiovascular: Negative for chest pain and leg swelling.  Gastrointestinal: Positive for abdominal pain. Negative for nausea and vomiting.  Genitourinary: Negative for dysuria.  Musculoskeletal: Negative for joint pain.  Skin: Negative for rash.  Neurological: Negative for focal weakness.    Blood pressure 112/76, pulse 96, temperature 98.4 F (36.9 C), temperature source Oral, resp. rate 18, height '6\' 2"'  (1.88 m), weight 111.131 kg (245 lb), SpO2 97 %. Physical Exam  Constitutional: He is oriented to person, place, and time. He appears well-developed and well-nourished. No distress.  HENT:  Head: Normocephalic.  Mouth/Throat: Oropharynx is clear and moist. No oropharyngeal exudate.  Eyes: EOM are normal. Pupils are equal, round, and reactive to light. No scleral icterus.  Neck: Neck supple. No JVD present. No thyromegaly present.  Cardiovascular:   No murmur heard. Regular rate and rhythm.  Respiratory: No respiratory distress.  Clear to ascultation. No dullness to precussion.  GI: Soft. Bowel sounds are normal.  Tender on right side with no rebound or guarding.  Musculoskeletal: He exhibits no edema or tenderness.  Lymphadenopathy:    He has no cervical adenopathy.  Neurological: He is alert and oriented to person, place, and time. No cranial nerve deficit.  Skin: Skin is warm and dry. No rash noted.     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 At this point likely not needing urgent surgery. GI evaluated patient and rec IV abx and repeat CT in a few days. Will admit, start IV zosyn and consult surgery.  2. Abdominal Pain: IV dilauded PRN.  Time spent = 70mn  JBaxter Hire8/13/2016, 7:22 PM

## 2014-08-24 NOTE — Progress Notes (Signed)
ANTIBIOTIC CONSULT NOTE - INITIAL  Pharmacy Consult for Zosyn dosing Indication: Intra-abdominal infection  Allergies  Allergen Reactions  . Other Anaphylaxis    Mushrooms    Patient Measurements: Height: 6\' 2"  (188 cm) Weight: 238 lb 4.8 oz (108.092 kg) IBW/kg (Calculated) : 82.2 Adjusted Body Weight: n/a  Vital Signs: Temp: 98.4 F (36.9 C) (08/13 2100) Temp Source: Oral (08/13 1119) BP: 126/73 mmHg (08/13 2100) Pulse Rate: 86 (08/13 2100) Intake/Output from previous day:   Intake/Output from this shift:    Labs:  Recent Labs  08/24/14 1127  WBC 9.4  HGB 12.2*  PLT 419  CREATININE 0.98   Estimated Creatinine Clearance: 144.4 mL/min (by C-G formula based on Cr of 0.98). No results for input(s): VANCOTROUGH, VANCOPEAK, VANCORANDOM, GENTTROUGH, GENTPEAK, GENTRANDOM, TOBRATROUGH, TOBRAPEAK, TOBRARND, AMIKACINPEAK, AMIKACINTROU, AMIKACIN in the last 72 hours.   Microbiology: No results found for this or any previous visit (from the past 720 hour(s)).  Medical History: History reviewed. No pertinent past medical history.  Medications:   Assessment: UA: (-)  Goal of Therapy:  Resolve infection  Plan:  Zosyn 3.375 grams q 8 hours ordered.  Tyson Masin S 08/24/2014,9:48 PM

## 2014-08-24 NOTE — Consult Note (Signed)
Pt with complicated appendectomy 5 years ago was told that " it looked like the appendix had ruptured a year before" (due to presence of multiple small pockets of abcess and 2 of them were drained percutaneously before the surgery with entrance points near the buttocks.  Has been having pain similar to then with 8/10 scale.  Ct scan showed "a spectrum of findings in the right side of the abd which likely represents advanced Crohn's disease with a combination of phlegmon and multiple tiny abcesses.  He will need to come in hospital for iv antibiotics.  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  His father has Crohn's disease.  Full note to follow in morning.  Will look for [facility] records tomorrow. Hospitalist to admit to [facility], surgery consult notified.

## 2014-08-25 LAB — IRON AND TIBC
Iron: 15 ug/dL — ABNORMAL LOW (ref 45–182)
SATURATION RATIOS: 6 % — AB (ref 17.9–39.5)
TIBC: 235 ug/dL — ABNORMAL LOW (ref 250–450)
UIBC: 220 ug/dL

## 2014-08-25 LAB — FERRITIN: Ferritin: 224 ng/mL (ref 24–336)

## 2014-08-25 NOTE — Progress Notes (Signed)
[facility] [facility] [place] NAME: Tony Odonnell    MR#:  [id number]  DATE OF BIRTH:  07-Sep-1985  SUBJECTIVE: I have an abdominal pain but feel better today CHIEF COMPLAINT:   Chief Complaint  Patient presents with  . Abdominal Pain   patient is a 29 year old male with past medical history significant for history of appendectomy which revealed abdominal abscesses drained percutaneously presents to the hospital was four-week history of right-sided abdominal pains. CT of abdomen was performed which revealed multiple intestinal abscesses and fistula, patient is admitted for antibiotic therapy. He complains of right-sided right lower quadrant abdominal pain, however, denies any nausea or vomiting. Admits of having bowel movement today and passing gas. He was started on clear liquid diet.   Review of Systems  Constitutional: Negative for fever, chills and weight loss.  HENT: Negative for congestion.   Eyes: Negative for blurred vision and double vision.  Respiratory: Negative for cough, sputum production, shortness of breath and wheezing.   Cardiovascular: Negative for chest pain, palpitations, orthopnea, leg swelling and PND.  Gastrointestinal: Positive for abdominal pain. Negative for nausea, vomiting, diarrhea, constipation and blood in stool.  Genitourinary: Negative for dysuria, urgency, frequency and hematuria.  Musculoskeletal: Negative for falls.  Neurological: Negative for dizziness, tremors, focal weakness and headaches.  Endo/Heme/Allergies: Does not bruise/bleed easily.  Psychiatric/Behavioral: Negative for depression. The patient does not have insomnia.     VITAL SIGNS: Blood pressure 116/68, pulse 87, temperature 98.7 F (37.1 C), temperature source Oral, resp. rate 17, height 6\' 2"  (1.88 m), weight 108.092 kg (238 lb 4.8 oz), SpO2 98 %.  PHYSICAL EXAMINATION:   GENERAL:  29 y.o.-year-old patient lying in the bed with no acute  distress.  EYES: Pupils equal, round, reactive to light and accommodation. No scleral icterus. Extraocular muscles intact.  HEENT: Head atraumatic, normocephalic. Oropharynx and nasopharynx clear.  NECK:  Supple, no jugular venous distention. No thyroid enlargement, no tenderness.  LUNGS: Normal breath sounds bilaterally, no wheezing, rales,rhonchi or crepitation. No use of accessory muscles of respiration.  CARDIOVASCULAR: S1, S2 normal. No murmurs, rubs, or gallops.  ABDOMEN: Soft, right lower quadrant tenderness, voluntary guarding, nondistended. Bowel sounds active. No organomegaly or mass.  EXTREMITIES: No pedal edema, cyanosis, or clubbing.  NEUROLOGIC: Cranial nerves II through XII are intact. Muscle strength 5/5 in all extremities. Sensation intact. Gait not checked.  PSYCHIATRIC: The patient is alert and oriented x 3.  SKIN: No obvious rash, lesion, or ulcer.   ORDERS/RESULTS REVIEWED:   CBC  Recent Labs Lab 08/24/14 1127  WBC 9.4  HGB 12.2*  HCT 39.5*  PLT 419  MCV 73.5*  MCH 22.8*  MCHC 31.0*  RDW 16.7*   ------------------------------------------------------------------------------------------------------------------  Chemistries   Recent Labs Lab 08/24/14 1127  NA 138  K 4.2  CL 103  CO2 26  GLUCOSE 100*  BUN 11  CREATININE 0.98  CALCIUM 9.3  AST 17  ALT 22  ALKPHOS 115  BILITOT 0.8   ------------------------------------------------------------------------------------------------------------------ estimated creatinine clearance is 144.4 mL/min (by C-G formula based on Cr of 0.98). ------------------------------------------------------------------------------------------------------------------ No results for input(s): TSH, T4TOTAL, T3FREE, THYROIDAB in the last 72 hours.  Invalid input(s): FREET3  Cardiac Enzymes No results for input(s): CKMB, TROPONINI, MYOGLOBIN in the last 168 hours.  Invalid input(s):  CK ------------------------------------------------------------------------------------------------------------------ Invalid input(s): POCBNP ---------------------------------------------------------------------------------------------------------------  RADIOLOGY: Ct Abdomen Pelvis W Contrast  08/24/2014   CLINICAL DATA:  29 year old male with 2 week history of right-sided abdominal pain intermittently.  History of prior appendectomy.  EXAM: CT ABDOMEN AND PELVIS WITH CONTRAST  TECHNIQUE: Multidetector CT imaging of the abdomen and pelvis was performed using the standard protocol following bolus administration of intravenous contrast.  CONTRAST:  159mL OMNIPAQUE IOHEXOL 350 MG/ML SOLN  COMPARISON:  CT the abdomen and pelvis 07/13/2009.  FINDINGS: Lower chest:  Unremarkable.  Hepatobiliary: No cystic or solid hepatic lesions. No intra or extrahepatic biliary ductal dilatation. Gallbladder is normal in appearance.  Pancreas: No pancreatic mass. No pancreatic ductal dilatation. No pancreatic or peripancreatic fluid or inflammatory changes.  Spleen: Unremarkable.  Adrenals/Urinary Tract: Bilateral adrenal glands and the right kidney are normal in appearance. Sub cm low-attenuation lesion in the lower pole of the left kidney is too small to characterize, but statistically likely a small cyst. No hydroureteronephrosis. Urinary bladder is normal in appearance.  Stomach/Bowel: The appearance of the stomach is normal. No pathologic dilatation of small bowel or colon. Status post appendectomy. In the right side of the abdomen there are multiple loops of bowel which appear intimately tethered to one another, with apparent inter- communications between loops of small bowel and adjacent loops of both small bowel and colon (i.e., both enteroenteric fistulae and enterocolonic fistulae). The terminal ileum (image 58 of series 2) demonstrates severe mural thickening and marked narrowing (i.e., there appears to be a stricture  of the terminal ileum). In the more proximal ileum, best appreciated on image 60 of series 2 there is profound mural thickening. There appear to be a multiple small extra luminal collections of fluid which are rim enhancing, with extensive surrounding inflammation, and extension to the overlying abdominal wall musculature in the right side of the abdomen anterior to the ascending colon, concerning for combination of phlegmon and multiple tiny abscesses. On coronal images (image 61 of series 5), the largest of these small rim enhancing fluid collections measures approximately 1.3 x 1.9 cm and appears to be extending into the overlying transversus abdominis musculature.  Vascular/Lymphatic: No significant atherosclerotic disease, aneurysm or dissection identified in the abdominal or pelvic vasculature. Several borderline enlarged ileocolic lymph nodes are noted, presumably reactive.  Reproductive: Prostate gland and seminal vesicles are unremarkable in appearance.  Other: No significant volume of ascites.  No pneumoperitoneum.  Musculoskeletal: There are no aggressive appearing lytic or blastic lesions noted in the visualized portions of the skeleton.  IMPRESSION: 1. Spectrum of findings in the right side of the abdomen, as detailed above, which almost certainly represents advanced Crohn's disease. 2. Status post appendectomy. These results were called by telephone at the time of interpretation on 08/24/2014 at 4:06 pm to Dr. Reita Cliche, who verbally acknowledged these results.   Electronically Signed   By: Vinnie Langton M.D.   On: 08/24/2014 16:08   US Abdomen Limited Ruq  08/24/2014   CLINICAL DATA:  Right upper quadrant pain  EXAM: US ABDOMEN LIMITED - RIGHT UPPER QUADRANT  COMPARISON:  None.  FINDINGS: Gallbladder:  No gallstones or wall thickening visualized. No sonographic Murphy sign noted.  Common bile duct:  Diameter: 4.7 mm in diameter within normal limits.  Liver:  No focal lesion identified. Within normal  limits in parenchymal echogenicity.  IMPRESSION: Unremarkable right upper quadrant ultrasound.   Electronically Signed   By: Lahoma Crocker M.D.   On: 08/24/2014 14:27    EKG:  Orders placed or performed during the hospital encounter of 08/08/14  . ED EKG  . ED EKG  . EKG    ASSESSMENT AND PLAN:  Active Problems:  Intestinal fistula 1. 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 He is being started on clear liquid diet by gastroenterologist. Burnis Medin continue antibiotic therapy with Zosyn, surgical, as well as gastroenterology input is appreciated, clinically relatively stable and improving, although possibility for surgical therapy is present 2. Hyponatremia, likely dehydration, improved on IV fluids, continue 3. Leukocytosis, improved with therapy 4. Anemia. Get iron checked and initiate supplementation whenever possible   Management plans discussed with the patient, family and they are in agreement.   DRUG ALLERGIES:  Allergies  Allergen Reactions  . Other Anaphylaxis    Mushrooms    CODE STATUS:     Code Status Orders        Start     Ordered   08/24/14 2119  Full code   Continuous     08/24/14 2118      TOTAL TIME TAKING CARE OF THIS PATIENT: 40 minutes.    Theodoro Grist M.D on 08/25/2014 at 1:53 PM  Between 7am to 6pm - Pager - [phone]  After 6pm go to [email or web address] - password [email or web address] [facility]  [facility]  Office  [phone]  CC: Primary care physician; No PCP Per Patient

## 2014-08-25 NOTE — Progress Notes (Signed)
Subjective:   Events of his admission were reviewed. I have reviewed his CT scan. He's feeling better this morning with less abdominal pain and no complaints of significant tenderness. He is not nauseated. He denies any fever or chills.  Vital signs in last 24 hours: Temp:  [98.4 F (36.9 C)-98.7 F (37.1 C)] 98.7 F (37.1 C) (08/14 0802) Pulse Rate:  [83-111] 87 (08/14 0802) Resp:  [17-20] 17 (08/14 0802) BP: (112-135)/(68-80) 116/68 mmHg (08/14 0802) SpO2:  [97 %-100 %] 98 % (08/14 0802) Weight:  [238 lb 4.8 oz (108.092 kg)-245 lb (111.131 kg)] 238 lb 4.8 oz (108.092 kg) (08/13 2100) Last BM Date: 08/24/14  Intake/Output from previous day: 08/13 0701 - 08/14 0700 In: 774 [I.V.:774] Out: 650 [Urine:650]  Exam:  Abdomen is minimally tender on the right side with no rebound or guarding. Does have active bowel sounds.  Lab Results:  CBC  Recent Labs  08/24/14 1127  WBC 9.4  HGB 12.2*  HCT 39.5*  PLT 419   CMP     Component Value Date/Time   NA 138 08/24/2014 1127   K 4.2 08/24/2014 1127   CL 103 08/24/2014 1127   CO2 26 08/24/2014 1127   GLUCOSE 100* 08/24/2014 1127   BUN 11 08/24/2014 1127   CREATININE 0.98 08/24/2014 1127   CALCIUM 9.3 08/24/2014 1127   PROT 8.4* 08/24/2014 1127   ALBUMIN 3.7 08/24/2014 1127   AST 17 08/24/2014 1127   ALT 22 08/24/2014 1127   ALKPHOS 115 08/24/2014 1127   BILITOT 0.8 08/24/2014 1127   GFRNONAA >60 08/24/2014 1127   GFRAA >60 08/24/2014 1127   PT/INR No results for input(s): LABPROT, INR in the last 72 hours.  Studies/Results: Ct Abdomen Pelvis W Contrast  08/24/2014   CLINICAL DATA:  29 year old male with 2 week history of right-sided abdominal pain intermittently. History of prior appendectomy.  EXAM: CT ABDOMEN AND PELVIS WITH CONTRAST  TECHNIQUE: Multidetector CT imaging of the abdomen and pelvis was performed using the standard protocol following bolus administration of intravenous contrast.  CONTRAST:  127mL OMNIPAQUE  IOHEXOL 350 MG/ML SOLN  COMPARISON:  CT the abdomen and pelvis 07/13/2009.  FINDINGS: Lower chest:  Unremarkable.  Hepatobiliary: No cystic or solid hepatic lesions. No intra or extrahepatic biliary ductal dilatation. Gallbladder is normal in appearance.  Pancreas: No pancreatic mass. No pancreatic ductal dilatation. No pancreatic or peripancreatic fluid or inflammatory changes.  Spleen: Unremarkable.  Adrenals/Urinary Tract: Bilateral adrenal glands and the right kidney are normal in appearance. Sub cm low-attenuation lesion in the lower pole of the left kidney is too small to characterize, but statistically likely a small cyst. No hydroureteronephrosis. Urinary bladder is normal in appearance.  Stomach/Bowel: The appearance of the stomach is normal. No pathologic dilatation of small bowel or colon. Status post appendectomy. In the right side of the abdomen there are multiple loops of bowel which appear intimately tethered to one another, with apparent inter- communications between loops of small bowel and adjacent loops of both small bowel and colon (i.e., both enteroenteric fistulae and enterocolonic fistulae). 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 In the more proximal ileum, best appreciated on image 60 of series 2 there is profound mural thickening. There appear to be a multiple small extra luminal collections of fluid which are rim enhancing, with extensive surrounding inflammation, and extension to the overlying abdominal wall musculature in  the right side of the abdomen anterior to the ascending colon, concerning for combination of phlegmon and multiple tiny abscesses. On coronal images (image 61 of series 5), the largest of these small rim enhancing fluid collections measures approximately 1.3 x 1.9 cm and appears to be extending into the overlying transversus abdominis musculature.   Vascular/Lymphatic: No significant atherosclerotic disease, aneurysm or dissection identified in the abdominal or pelvic vasculature. Several borderline enlarged ileocolic lymph nodes are noted, presumably reactive.  Reproductive: Prostate gland and seminal vesicles are unremarkable in appearance.  Other: No significant volume of ascites.  No pneumoperitoneum.  Musculoskeletal: There are no aggressive appearing lytic or blastic lesions noted in the visualized portions of the skeleton.  IMPRESSION: 1. Spectrum of findings in the right side of the abdomen, as detailed above, which almost certainly represents advanced Crohn's disease. 2. Status post appendectomy. These results were called by telephone at the time of interpretation on 08/24/2014 at 4:06 pm to Dr. Reita Cliche, who verbally acknowledged these results.   Electronically Signed   By: Vinnie Langton M.D.   On: 08/24/2014 16:08   US Abdomen Limited Ruq  08/24/2014   CLINICAL DATA:  Right upper quadrant pain  EXAM: US ABDOMEN LIMITED - RIGHT UPPER QUADRANT  COMPARISON:  None.  FINDINGS: Gallbladder:  No gallstones or wall thickening visualized. No sonographic Murphy sign noted.  Common bile duct:  Diameter: 4.7 mm in diameter within normal limits.  Liver:  No focal lesion identified. Within normal limits in parenchymal echogenicity.  IMPRESSION: Unremarkable right upper quadrant ultrasound.   Electronically Signed   By: Lahoma Crocker M.D.   On: 08/24/2014 14:27    Assessment/Plan: We will continue antibiotic therapy for several days. Hopefully his symptoms will improve and we can convert him to antibiotics by mouth. At that point we'll see about involving further GI evaluation for possible Crohn's outpatient therapy. If he does not improve he may need surgery with a potential resection at the present time. I reviewed those options with him in detail. He is comfortable with that plan.

## 2014-08-25 NOTE — Consult Note (Signed)
Pt with complicated appendectomy 5 years ago was told that " it looked like the appendix had ruptured a year before" (due to presence of multiple small pockets of abcess and 2 of them were drained percutaneously before the surgery with entrance points near the buttocks.  Has been having pain similar to then with 8/10 scale.  Ct scan showed "a spectrum of findings in the right side of the abd which likely represents advanced Crohn's disease with a combination of phlegmon and multiple tiny abcesses.  He will need to come in hospital for iv antibiotics.  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  His father has Crohn's disease.  Full note to follow in morning.  Will look for [facility] records tomorrow. Hospitalist to admit to [facility], surgery consult notified. GI Inpatient Consult Note  Reason for Consult:  Abnormal CT suggesting Crohn's disease   Attending Requesting Consult:  History of Present Illness:  See first paragraph of page. Tony Odonnell is a 29 y.o. male  Past Medical History:  History reviewed. No pertinent past medical history.  Problem List: Patient Active Problem List   Diagnosis Date Noted  . Intestinal fistula 08/24/2014  . Crohn's disease of both small and large intestine with fistula     Past Surgical History: Past Surgical History  Procedure Laterality Date  . Appendectomy      Allergies: Allergies  Allergen Reactions  . Other Anaphylaxis    Mushrooms    Home Medications: Prescriptions prior to admission  Medication Sig Dispense Refill Last Dose  . acetaminophen (TYLENOL) 500 MG tablet Take 1,000 mg by mouth every 6 (six) hours as needed for mild pain, moderate pain or headache.   08/23/2014 at Unknown time   Home medication reconciliation was completed with the patient.   Scheduled Inpatient Medications:   . piperacillin-tazobactam (ZOSYN)  IV  3.375 g Intravenous 3 times per day    Continuous Inpatient Infusions:   . sodium chloride 150 mL/hr at  08/24/14 2130    PRN Inpatient Medications:  acetaminophen **OR** acetaminophen, HYDROmorphone (DILAUDID) injection  Family History:   The patient's family history is positive for Crohn's disease in his father.  Tonsils out in 2ed grade.  Social History:   reports that he has never smoked. He does not have any smokeless tobacco history on file. He reports that he does not drink alcohol. The patient denies ETOH, tobacco, or drug use.   Review of Systems: Constitutional: Weight is stable. Started feeling bAd  A couple of weeks ago an went to ER and had [place] which did not show much, continued pain caused him to come back and CT scan in ER showed significant findings of phlegmon, multiple tiny abscesses,  and fistulae both enteroenteric and enterocolonic Eyes: No changes in vision. ENT: No oral lesions, sore throat.  GI: see HPI.  Heme/Lymph: No easy bruising.  CV: No chest pain.  GU: No hematuria.  Integumentary: No rashes.  Neuro: No headaches.  Psych: No depression/anxiety.  Endocrine: No heat/cold intolerance.  Allergic/Immunologic: No urticaria.  Resp: No cough, SOB.  Musculoskeletal: No joint swelling.    Physical Examination: BP 116/68 mmHg  Pulse 87  Temp(Src) 98.7 F (37.1 C) (Oral)  Resp 17  Ht 6\' 2"  (1.88 m)  Wt 108.092 kg (238 lb 4.8 oz)  BMI 30.58 kg/m2  SpO2 98% Gen: NAD, alert and oriented x 4 HEENT: PEERLA, EOMI, Neck: supple, no JVD or thyromegaly Chest: CTA bilaterally, no wheezes, crackles, or other adventitious sounds CV:  RRR, no m/g/c/r Abd: soft, there is tenderness in right abdomen, mostly right mid and RLQ, no signif pain with coughing. Ext: no edema, well perfused with 2+ pulses, Skin: no rash or lesions noted Lymph: no LAD  Data: Lab Results  Component Value Date   WBC 9.4 08/24/2014   HGB 12.2* 08/24/2014   HCT 39.5* 08/24/2014   MCV 73.5* 08/24/2014   PLT 419 08/24/2014    Recent Labs Lab 08/24/14 1127  HGB 12.2*   Lab Results   Component Value Date   NA 138 08/24/2014   K 4.2 08/24/2014   CL 103 08/24/2014   CO2 26 08/24/2014   BUN 11 08/24/2014   CREATININE 0.98 08/24/2014   Lab Results  Component Value Date   ALT 22 08/24/2014   AST 17 08/24/2014   ALKPHOS 115 08/24/2014   BILITOT 0.8 08/24/2014   No results for input(s): APTT, INR, PTT in the last 168 hours. Assessment/Plan: Tony Odonnell is a 29 y.o. male With symptoms and CT findings of Crohn;s disease with multiple intra-abdominal pathology with a hx of appendectomy 6 years ago showing abcesses which were drained percutaneously.  Also shows significant narrowing of terminal ileum to a stricture and profound mural thickening.  Recommendations:  Zosyn iv, Surgical imput as to type and scheduling of possible surgery.  Consideration for transfer to [facility] due to complexity of problems.  Due to living near the hospital he was hoping to remain in [place].  Hospitalist surgery service consulted.  Thank you for the consult. Please call with questions or concerns.  Gaylyn Cheers, MD

## 2014-08-25 NOTE — Consult Note (Signed)
Pt feels better with less pain.  WBC 9.4, hgb 12.2, plt 419, MET C OK.  PE shows bowel sounds present, palpation with less tenderness today.  Will order sed rate and CRP for baseline. Plan several days in hospital on iv antibiotics and repeat CT scan and consider medical therapy after that. Possible TNF drug, possible steroids for short course.  May need CT enterography after medical treatment.

## 2014-08-26 LAB — BASIC METABOLIC PANEL
Anion gap: 9 (ref 5–15)
BUN: 7 mg/dL (ref 6–20)
CALCIUM: 8.6 mg/dL — AB (ref 8.9–10.3)
CO2: 25 mmol/L (ref 22–32)
CREATININE: 0.85 mg/dL (ref 0.61–1.24)
Chloride: 104 mmol/L (ref 101–111)
GFR calc non Af Amer: >60 mL/min (ref >60)
Glucose, Bld: 79 mg/dL (ref 65–99)
Potassium: 4.3 mmol/L (ref 3.5–5.1)
SODIUM: 138 mmol/L (ref 135–145)

## 2014-08-26 LAB — C-REACTIVE PROTEIN: CRP: 19.9 mg/dL — AB (ref <1.0)

## 2014-08-26 LAB — CBC
HEMATOCRIT: 31.7 % — AB (ref 40.0–52.0)
Hemoglobin: 10.1 g/dL — ABNORMAL LOW (ref 13.0–18.0)
MCH: 23.5 pg — ABNORMAL LOW (ref 26.0–34.0)
MCHC: 31.9 g/dL — AB (ref 32.0–36.0)
MCV: 73.5 fL — ABNORMAL LOW (ref 80.0–100.0)
Platelets: 312 10*3/uL (ref 150–440)
RBC: 4.31 MIL/uL — ABNORMAL LOW (ref 4.40–5.90)
RDW: 16.8 % — AB (ref 11.5–14.5)
WBC: 7 10*3/uL (ref 3.8–10.6)

## 2014-08-26 LAB — SEDIMENTATION RATE: SED RATE: 82 mm/h — AB (ref 0–15)

## 2014-08-26 MED ORDER — OXYCODONE HCL 5 MG PO TABS
5.0000 mg | ORAL_TABLET | ORAL | Status: DC | PRN
  Administered 2014-08-26: 5 mg via ORAL
  Filled 2014-08-26: qty 1

## 2014-08-26 NOTE — Consult Note (Signed)
Pt with feeling much better, coughing without pain.  His SED rate was in mid 80 and CRP was 20.  Continue antibiotics and consider colonoscopy Friday.

## 2014-08-26 NOTE — Progress Notes (Signed)
[place] [facility] [place] NAME: Tony Odonnell    MR#:  [id number]  DATE OF BIRTH:  14-Jul-1985  SUBJECTIVE: I have an abdominal pain but feel better today CHIEF COMPLAINT:   Chief Complaint  Patient presents with  . Abdominal Pain   patient is a 29 year old male with past medical history significant for history of appendectomy which revealed abdominal abscesses drained percutaneously presents to the hospital was four-week history of right-sided abdominal pains. CT of abdomen was performed which revealed multiple intestinal abscesses and fistula, patient is admitted for antibiotic therapy. He complains of right-sided right lower quadrant abdominal pain, however, denies any nausea or vomiting. Admits of having bowel movement today and passing gas. He was started on clear liquid diet.  Feels satisfactory today. Denies any nausea or abdominal discomfort, although was given 1 dose of Dilaudid in the middle of the night as well as today early in the morning had few bowel movements which were loose earlier today. No blood. He is willing to try oral medication for pain  Review of Systems  Constitutional: Negative for fever, chills and weight loss.  HENT: Negative for congestion.   Eyes: Negative for blurred vision and double vision.  Respiratory: Negative for cough, sputum production, shortness of breath and wheezing.   Cardiovascular: Negative for chest pain, palpitations, orthopnea, leg swelling and PND.  Gastrointestinal: Positive for abdominal pain. Negative for nausea, vomiting, diarrhea, constipation and blood in stool.  Genitourinary: Negative for dysuria, urgency, frequency and hematuria.  Musculoskeletal: Negative for falls.  Neurological: Negative for dizziness, tremors, focal weakness and headaches.  Endo/Heme/Allergies: Does not bruise/bleed easily.  Psychiatric/Behavioral: Negative for depression. The patient does not have insomnia.      VITAL SIGNS: Blood pressure 125/72, pulse 94, temperature 98.6 F (37 C), temperature source Oral, resp. rate 17, height 6\' 2"  (1.88 m), weight 108.092 kg (238 lb 4.8 oz), SpO2 100 %.  PHYSICAL EXAMINATION:   GENERAL:  29 y.o.-year-old patient lying in the bed with no acute distress.  EYES: Pupils equal, round, reactive to light and accommodation. No scleral icterus. Extraocular muscles intact.  HEENT: Head atraumatic, normocephalic. Oropharynx and nasopharynx clear.  NECK:  Supple, no jugular venous distention. No thyroid enlargement, no tenderness.  LUNGS: Normal breath sounds bilaterally, no wheezing, rales,rhonchi or crepitation. No use of accessory muscles of respiration.  CARDIOVASCULAR: S1, S2 normal. No murmurs, rubs, or gallops.  ABDOMEN: Soft, right lower quadrant tenderness, voluntary guarding, nondistended. Bowel sounds active. No organomegaly or mass.  EXTREMITIES: No pedal edema, cyanosis, or clubbing.  NEUROLOGIC: Cranial nerves II through XII are intact. Muscle strength 5/5 in all extremities. Sensation intact. Gait not checked.  PSYCHIATRIC: The patient is alert and oriented x 3.  SKIN: No obvious rash, lesion, or ulcer.   ORDERS/RESULTS REVIEWED:   CBC  Recent Labs Lab 08/24/14 1127 08/26/14 0430  WBC 9.4 7.0  HGB 12.2* 10.1*  HCT 39.5* 31.7*  PLT 419 312  MCV 73.5* 73.5*  MCH 22.8* 23.5*  MCHC 31.0* 31.9*  RDW 16.7* 16.8*   ------------------------------------------------------------------------------------------------------------------  Chemistries   Recent Labs Lab 08/24/14 1127 08/26/14 0430  NA 138 138  K 4.2 4.3  CL 103 104  CO2 26 25  GLUCOSE 100* 79  BUN 11 7  CREATININE 0.98 0.85  CALCIUM 9.3 8.6*  AST 17  --   ALT 22  --   ALKPHOS 115  --   BILITOT 0.8  --    ------------------------------------------------------------------------------------------------------------------  estimated creatinine clearance is 166.4 mL/min (by C-G  formula based on Cr of 0.85). ------------------------------------------------------------------------------------------------------------------ No results for input(s): TSH, T4TOTAL, T3FREE, THYROIDAB in the last 72 hours.  Invalid input(s): FREET3  Cardiac Enzymes No results for input(s): CKMB, TROPONINI, MYOGLOBIN in the last 168 hours.  Invalid input(s): CK ------------------------------------------------------------------------------------------------------------------ Invalid input(s): POCBNP ---------------------------------------------------------------------------------------------------------------  RADIOLOGY: Ct Abdomen Pelvis W Contrast  08/24/2014   CLINICAL DATA:  29 year old male with 2 week history of right-sided abdominal pain intermittently. History of prior appendectomy.  EXAM: CT ABDOMEN AND PELVIS WITH CONTRAST  TECHNIQUE: Multidetector CT imaging of the abdomen and pelvis was performed using the standard protocol following bolus administration of intravenous contrast.  CONTRAST:  146mL OMNIPAQUE IOHEXOL 350 MG/ML SOLN  COMPARISON:  CT the abdomen and pelvis 07/13/2009.  FINDINGS: Lower chest:  Unremarkable.  Hepatobiliary: No cystic or solid hepatic lesions. No intra or extrahepatic biliary ductal dilatation. Gallbladder is normal in appearance.  Pancreas: No pancreatic mass. No pancreatic ductal dilatation. No pancreatic or peripancreatic fluid or inflammatory changes.  Spleen: Unremarkable.  Adrenals/Urinary Tract: Bilateral adrenal glands and the right kidney are normal in appearance. Sub cm low-attenuation lesion in the lower pole of the left kidney is too small to characterize, but statistically likely a small cyst. No hydroureteronephrosis. Urinary bladder is normal in appearance.  Stomach/Bowel: The appearance of the stomach is normal. No pathologic dilatation of small bowel or colon. Status post appendectomy. In the right side of the abdomen there are multiple loops of  bowel which appear intimately tethered to one another, with apparent inter- communications between loops of small bowel and adjacent loops of both small bowel and colon (i.e., both enteroenteric fistulae and enterocolonic fistulae). 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 In the more proximal ileum, best appreciated on image 60 of series 2 there is profound mural thickening. There appear to be a multiple small extra luminal collections of fluid which are rim enhancing, with extensive surrounding inflammation, and extension to the overlying abdominal wall musculature in the right side of the abdomen anterior to the ascending colon, concerning for combination of phlegmon and multiple tiny abscesses. On coronal images (image 61 of series 5), the largest of these small rim enhancing fluid collections measures approximately 1.3 x 1.9 cm and appears to be extending into the overlying transversus abdominis musculature.  Vascular/Lymphatic: No significant atherosclerotic disease, aneurysm or dissection identified in the abdominal or pelvic vasculature. Several borderline enlarged ileocolic lymph nodes are noted, presumably reactive.  Reproductive: Prostate gland and seminal vesicles are unremarkable in appearance.  Other: No significant volume of ascites.  No pneumoperitoneum.  Musculoskeletal: There are no aggressive appearing lytic or blastic lesions noted in the visualized portions of the skeleton.  IMPRESSION: 1. Spectrum of findings in the right side of the abdomen, as detailed above, which almost certainly represents advanced Crohn's disease. 2. Status post appendectomy. These results were called by telephone at the time of interpretation on 08/24/2014 at 4:06 pm to Dr. Reita Cliche, who verbally acknowledged these results.   Electronically Signed   By: Vinnie Langton M.D.   On: 08/24/2014 16:08     EKG:  Orders placed or performed during the hospital encounter of 08/08/14  . ED EKG  . ED EKG  . 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 He is to be continued on clear liquid diet , advance it by gastroenterologist recommendations. She has good bowel movements and no nausea . Continue Zosyn, surgical, as well as gastroenterology input is appreciated, clinically stable 2. Hyponatremia, likely dehydration, resolved on IV fluids, continue at lower rate since patient is taking good by mouth 3. Leukocytosis, improved with therapy 4. Anemia. Iron studies revealed low iron saturation and normal ferritin level significant for multifactorial anemia, iron deficiency as well as anemia of chronic disease,   initiate Fe supplementation whenever possible by gastroenterology   Management plans discussed with the patient, family and they are in agreement.   DRUG ALLERGIES:  Allergies  Allergen Reactions  . Other Anaphylaxis    Mushrooms    CODE STATUS:     Code Status Orders        Start     Ordered   08/24/14 2119  Full code   Continuous     08/24/14 2118      TOTAL TIME TAKING CARE OF THIS PATIENT: 35 minutes.    Theodoro Grist M.D on 08/26/2014 at 2:34 PM  Between 7am to 6pm - Pager - [phone]  After 6pm go to [email or web address] - password [email or web address] [facility]  [facility]  Office  [phone]  CC: Primary care physician; No PCP Per Patient

## 2014-08-26 NOTE — Progress Notes (Signed)
Subjective: 29 year old male with new onset Crohn's disease with multiple intra-abdominal infections. States he is much improved from admission. He has been tolerating PO and having bowel function. Denies any new complaints.  Vital signs in last 24 hours: Temp:  [98.6 F (37 C)] 98.6 F (37 C) (08/14 2356) Pulse Rate:  [92-94] 94 (08/15 0759) Resp:  [16-17] 17 (08/15 0759) BP: (120-125)/(67-75) 125/72 mmHg (08/15 0759) SpO2:  [98 %-100 %] 100 % (08/15 0759) Last BM Date: 08/25/14  Intake/Output from previous day: 08/14 0701 - 08/15 0700 In: 3314.3 [P.O.:480; I.V.:2834.3] Out: 500 [Urine:500]  Exam:  Abdomen is soft, minimally ttp in RLQ but without peritonitis. Bowel sounds are active.  Lab Results:  CBC  Recent Labs  08/24/14 1127 08/26/14 0430  WBC 9.4 7.0  HGB 12.2* 10.1*  HCT 39.5* 31.7*  PLT 419 312   CMP     Component Value Date/Time   NA 138 08/26/2014 0430   K 4.3 08/26/2014 0430   CL 104 08/26/2014 0430   CO2 25 08/26/2014 0430   GLUCOSE 79 08/26/2014 0430   BUN 7 08/26/2014 0430   CREATININE 0.85 08/26/2014 0430   CALCIUM 8.6* 08/26/2014 0430   PROT 8.4* 08/24/2014 1127   ALBUMIN 3.7 08/24/2014 1127   AST 17 08/24/2014 1127   ALT 22 08/24/2014 1127   ALKPHOS 115 08/24/2014 1127   BILITOT 0.8 08/24/2014 1127   GFRNONAA >60 08/26/2014 0430   GFRAA >60 08/26/2014 0430   PT/INR No results for input(s): LABPROT, INR in the last 72 hours.  Studies/Results: Ct Abdomen Pelvis W Contrast  08/24/2014   CLINICAL DATA:  29 year old male with 2 week history of right-sided abdominal pain intermittently. History of prior appendectomy.  EXAM: CT ABDOMEN AND PELVIS WITH CONTRAST  TECHNIQUE: Multidetector CT imaging of the abdomen and pelvis was performed using the standard protocol following bolus administration of intravenous contrast.  CONTRAST:  137mL OMNIPAQUE IOHEXOL 350 MG/ML SOLN  COMPARISON:  CT the abdomen and pelvis 07/13/2009.  FINDINGS: Lower chest:   Unremarkable.  Hepatobiliary: No cystic or solid hepatic lesions. No intra or extrahepatic biliary ductal dilatation. Gallbladder is normal in appearance.  Pancreas: No pancreatic mass. No pancreatic ductal dilatation. No pancreatic or peripancreatic fluid or inflammatory changes.  Spleen: Unremarkable.  Adrenals/Urinary Tract: Bilateral adrenal glands and the right kidney are normal in appearance. Sub cm low-attenuation lesion in the lower pole of the left kidney is too small to characterize, but statistically likely a small cyst. No hydroureteronephrosis. Urinary bladder is normal in appearance.  Stomach/Bowel: The appearance of the stomach is normal. No pathologic dilatation of small bowel or colon. Status post appendectomy. In the right side of the abdomen there are multiple loops of bowel which appear intimately tethered to one another, with apparent inter- communications between loops of small bowel and adjacent loops of both small bowel and colon (i.e., both enteroenteric fistulae and enterocolonic fistulae). 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 In the more proximal ileum, best appreciated on image 60 of series 2 there is profound mural thickening. There appear to be a multiple small extra luminal collections of fluid which are rim enhancing, with extensive surrounding inflammation, and extension to the overlying abdominal wall musculature in the right side of the abdomen anterior to the ascending colon, concerning for combination of phlegmon and multiple tiny abscesses. On coronal images (image 61  of series 5), the largest of these small rim enhancing fluid collections measures approximately 1.3 x 1.9 cm and appears to be extending into the overlying transversus abdominis musculature.  Vascular/Lymphatic: No significant atherosclerotic disease, aneurysm or dissection identified in the  abdominal or pelvic vasculature. Several borderline enlarged ileocolic lymph nodes are noted, presumably reactive.  Reproductive: Prostate gland and seminal vesicles are unremarkable in appearance.  Other: No significant volume of ascites.  No pneumoperitoneum.  Musculoskeletal: There are no aggressive appearing lytic or blastic lesions noted in the visualized portions of the skeleton.  IMPRESSION: 1. Spectrum of findings in the right side of the abdomen, as detailed above, which almost certainly represents advanced Crohn's disease. 2. Status post appendectomy. These results were called by telephone at the time of interpretation on 08/24/2014 at 4:06 pm to Dr. Reita Cliche, who verbally acknowledged these results.   Electronically Signed   By: Vinnie Langton M.D.   On: 08/24/2014 16:08   US Abdomen Limited Ruq  08/24/2014   CLINICAL DATA:  Right upper quadrant pain  EXAM: US ABDOMEN LIMITED - RIGHT UPPER QUADRANT  COMPARISON:  None.  FINDINGS: Gallbladder:  No gallstones or wall thickening visualized. No sonographic Murphy sign noted.  Common bile duct:  Diameter: 4.7 mm in diameter within normal limits.  Liver:  No focal lesion identified. Within normal limits in parenchymal echogenicity.  IMPRESSION: Unremarkable right upper quadrant ultrasound.   Electronically Signed   By: Lahoma Crocker M.D.   On: 08/24/2014 14:27    Assessment/Plan:  29 year old male with new diagnosis of Crohn's. Appears to have responded to medical management. Had lengthy discussion about the progression of Crohn's and the role of surgery in its treatment as a last line. No indication for urgent or emergent surgical intervention at this point.   Please call if surgery can be of any further assistance  Clayburn Pert, MD [place] [facility]

## 2014-08-27 MED ORDER — HYDROMORPHONE HCL 2 MG PO TABS
1.0000 mg | ORAL_TABLET | ORAL | Status: DC | PRN
  Administered 2014-08-27: 1 mg via ORAL
  Filled 2014-08-27: qty 1

## 2014-08-27 NOTE — Consult Note (Signed)
Pt feels about the same at this time.  Still some RLQ tenderness but not nearly as bad as on admission.  Plan to try him or full liquid diet tomorrow then back to clear Thursday for Friday colonoscopy.  Will contact [facility] for their opinion on post antibiotic plan.

## 2014-08-27 NOTE — Progress Notes (Signed)
[place] at [place] NAME: Tony Odonnell    MR#:  [id number]  DATE OF BIRTH:  [birth year]  SUBJECTIVE: I have an abdominal pain but feel better today CHIEF COMPLAINT:   Chief Complaint  Patient presents with  . Abdominal Pain   patient is a 29 year old male with past medical history significant for history of appendectomy which revealed abdominal abscesses drained percutaneously presents to the hospital was four-week history of right-sided abdominal pains. CT of abdomen was performed which revealed multiple intestinal abscesses and fistula, patient is admitted for antibiotic therapy. He complains of right-sided right lower quadrant abdominal pain, however, denies any nausea or vomiting. Admits of having bowel movement today and passing gas. He was started on clear liquid diet.  Feels satisfactory today. Admits of abdominal pain still is given Dilaudid since oxycodone is not working well. Tolerates clear liquid diet.. Dr. Vira Agar recommends colonoscopy on Friday  Review of Systems  Constitutional: Negative for fever, chills and weight loss.  HENT: Negative for congestion.   Eyes: Negative for blurred vision and double vision.  Respiratory: Negative for cough, sputum production, shortness of breath and wheezing.   Cardiovascular: Negative for chest pain, palpitations, orthopnea, leg swelling and PND.  Gastrointestinal: Positive for abdominal pain. Negative for nausea, vomiting, diarrhea, constipation and blood in stool.  Genitourinary: Negative for dysuria, urgency, frequency and hematuria.  Musculoskeletal: Negative for falls.  Neurological: Negative for dizziness, tremors, focal weakness and headaches.  Endo/Heme/Allergies: Does not bruise/bleed easily.  Psychiatric/Behavioral: Negative for depression. The patient does not have insomnia.     VITAL SIGNS: Blood pressure 118/73, pulse 74, temperature 98.3 F (36.8 C), temperature source  Oral, resp. rate 18, height 6\' 2"  (1.88 m), weight 108.092 kg (238 lb 4.8 oz), SpO2 100 %.  PHYSICAL EXAMINATION:   GENERAL:  29 y.o.-year-old patient lying in the bed with no acute distress.  EYES: Pupils equal, round, reactive to light and accommodation. No scleral icterus. Extraocular muscles intact.  HEENT: Head atraumatic, normocephalic. Oropharynx and nasopharynx clear.  NECK:  Supple, no jugular venous distention. No thyroid enlargement, no tenderness.  LUNGS: Normal breath sounds bilaterally, no wheezing, rales,rhonchi or crepitation. No use of accessory muscles of respiration.  CARDIOVASCULAR: S1, S2 normal. No murmurs, rubs, or gallops.  ABDOMEN: Soft, right lower quadrant tenderness, . Minimal voluntary guarding, nondistended. Bowel sounds active. No organomegaly or mass.  EXTREMITIES: No pedal edema, cyanosis, or clubbing.  NEUROLOGIC: Cranial nerves II through XII are intact. Muscle strength 5/5 in all extremities. Sensation intact. Gait not checked.  PSYCHIATRIC: The patient is alert and oriented x 3.  SKIN: No obvious rash, lesion, or ulcer.   ORDERS/RESULTS REVIEWED:   CBC  Recent Labs Lab 08/24/14 1127 08/26/14 0430  WBC 9.4 7.0  HGB 12.2* 10.1*  HCT 39.5* 31.7*  PLT 419 312  MCV 73.5* 73.5*  MCH 22.8* 23.5*  MCHC 31.0* 31.9*  RDW 16.7* 16.8*   ------------------------------------------------------------------------------------------------------------------  Chemistries   Recent Labs Lab 08/24/14 1127 08/26/14 0430  NA 138 138  K 4.2 4.3  CL 103 104  CO2 26 25  GLUCOSE 100* 79  BUN 11 7  CREATININE 0.98 0.85  CALCIUM 9.3 8.6*  AST 17  --   ALT 22  --   ALKPHOS 115  --   BILITOT 0.8  --    ------------------------------------------------------------------------------------------------------------------ estimated creatinine clearance is 166.4 mL/min (by C-G formula based on Cr of  0.85). ------------------------------------------------------------------------------------------------------------------ No results for input(s): TSH,  T4TOTAL, T3FREE, THYROIDAB in the last 72 hours.  Invalid input(s): FREET3  Cardiac Enzymes No results for input(s): CKMB, TROPONINI, MYOGLOBIN in the last 168 hours.  Invalid input(s): CK ------------------------------------------------------------------------------------------------------------------ Invalid input(s): POCBNP ---------------------------------------------------------------------------------------------------------------  RADIOLOGY: No results found.  EKG:  Orders placed or performed during the hospital encounter of 08/08/14  . ED EKG  . ED EKG  . EKG    ASSESSMENT AND PLAN:  Active Problems:   Intestinal fistula 1. 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 He is to be continued on clear liquid diet , advance it by gastroenterologist recommendations.Patient has been having bowel movements and no nausea . Continue Zosyn, surgical, as well as gastroenterology input is appreciated, clinically stable, likely colonoscopy on Friday by Dr. Vira Agar . Change oxycodone to Dilaudid by mouth  2. Hyponatremia, likely dehydration, resolved on IV fluids, continue at lower rate since patient is taking good by mouth 3. Leukocytosis, improved with therapy 4. Anemia. Iron studies revealed low iron saturation and normal ferritin level significant for multifactorial anemia, iron deficiency as well as anemia of chronic disease,   initiate Fe supplementation whenever possible by gastroenterology   Management plans discussed with the patient, family and they are in agreement.   DRUG ALLERGIES:  Allergies  Allergen Reactions  . Other Anaphylaxis    Mushrooms    CODE STATUS:     Code Status Orders        Start     Ordered   08/24/14 2119  Full code    Continuous     08/24/14 2118      TOTAL TIME TAKING CARE OF THIS PATIENT: 35 minutes.    Theodoro Grist M.D on 08/27/2014 at 3:14 PM  Between 7am to 6pm - Pager - [phone]  After 6pm go to [email or web address] - password [email or web address] [facility]  [place]  Office  [phone]  CC: Primary care physician; No PCP Per Patient

## 2014-08-27 NOTE — Progress Notes (Signed)
New order to advance diet to full liquid per Dr. Tiffany Kocher. Pt notified of change in diet.

## 2014-08-28 NOTE — Progress Notes (Signed)
[place] [facility] [place] NAME: Tony Odonnell    MR#:  [id number]  DATE OF BIRTH:  04-21-1985  SUBJECTIVE:  CHIEF COMPLAINT:   Chief Complaint  Patient presents with  . Abdominal Pain   Patient here due to abdominal pain, nausea, vomiting and abnormal CT scan findings suggestive of multiple intra-abdominal abscesses. As per the CT these findings are suggestive of advance Crohn's disease. Presently patient denies any abdominal pain, tolerating a full liquid diet well. No fever, no hematochezia bleeding overnight.  REVIEW OF SYSTEMS:    Review of Systems  Constitutional: Negative for fever and chills.  HENT: Negative for congestion and tinnitus.   Eyes: Negative for blurred vision and double vision.  Respiratory: Negative for cough, shortness of breath and wheezing.   Cardiovascular: Negative for chest pain, orthopnea and PND.  Gastrointestinal: Negative for nausea, vomiting, abdominal pain and diarrhea.  Genitourinary: Negative for dysuria and hematuria.  Neurological: Negative for dizziness, sensory change and focal weakness.  All other systems reviewed and are negative.   Nutrition: Full Tolerating Diet: Yes Tolerating PT: Ambulatory      DRUG ALLERGIES:   Allergies  Allergen Reactions  . Other Anaphylaxis    Mushrooms    VITALS:  Blood pressure 115/72, pulse 65, temperature 97.7 F (36.5 C), temperature source Oral, resp. rate 18, height 6\' 2"  (1.88 m), weight 108.092 kg (238 lb 4.8 oz), SpO2 100 %.  PHYSICAL EXAMINATION:   Physical Exam  GENERAL:  29 y.o.-year-old patient lying in the bed with no acute distress.  EYES: Pupils equal, round, reactive to light and accommodation. No scleral icterus. Extraocular muscles intact.  HEENT: Head atraumatic, normocephalic. Oropharynx and nasopharynx clear.  NECK:  Supple, no jugular venous distention. No thyroid enlargement, no tenderness.  LUNGS: Normal breath sounds  bilaterally, no wheezing, rales, rhonchi. No use of accessory muscles of respiration.  CARDIOVASCULAR: S1, S2 normal. No murmurs, rubs, or gallops.  ABDOMEN: Soft, nontender, nondistended. Bowel sounds present. No organomegaly or mass.  EXTREMITIES: No cyanosis, clubbing or edema b/l.    NEUROLOGIC: Cranial nerves II through XII are intact. No focal Motor or sensory deficits b/l.   PSYCHIATRIC: The patient is alert and oriented x 3. Good affect SKIN: No obvious rash, lesion, or ulcer.    LABORATORY PANEL:   CBC  Recent Labs Lab 08/26/14 0430  WBC 7.0  HGB 10.1*  HCT 31.7*  PLT 312   ------------------------------------------------------------------------------------------------------------------  Chemistries   Recent Labs Lab 08/24/14 1127 08/26/14 0430  NA 138 138  K 4.2 4.3  CL 103 104  CO2 26 25  GLUCOSE 100* 79  BUN 11 7  CREATININE 0.98 0.85  CALCIUM 9.3 8.6*  AST 17  --   ALT 22  --   ALKPHOS 115  --   BILITOT 0.8  --    ------------------------------------------------------------------------------------------------------------------  Cardiac Enzymes No results for input(s): TROPONINI in the last 168 hours. ------------------------------------------------------------------------------------------------------------------  RADIOLOGY:  No results found.   ASSESSMENT AND PLAN:   29 year old male with no significant past medical history except for an appendectomy who presented to the hospital due to abdominal pain and noted to have an abnormal CT scan suggestive of multiple tiny abscesses and advanced Crohn's disease. Next  #1 abdominal pain-this was likely secondary to the patient's Crohn's disease. -Patient was seen by gastroenterology and also surgery. As per surgery patient did not need any acute surgical intervention. -With supportive care with IV fluids, broad-spectrum IV antibiotics,  IV fluids patient has clinically improved. At present patient has no  pain abdominal pain, nausea, vomiting and is tolerating a full liquid diet well. -We will advance diet to a soft tomorrow morning and if tolerating well with DC home on oral antibiotics. I discussed this plan with Dr. Vira Agar.  #2 Crohn's disease-this hasn't been officially diagnosed with patient's CT scan was suggestive of it. -Patient will be discharged home with close follow-up with gastroenterology and patient will have a colonoscopy in the next 1-2 weeks.   All the records are reviewed and case discussed with Care Management/Social Workerr. Management plans discussed with the patient, family and they are in agreement.  CODE STATUS: Full  DVT Prophylaxis: Ambulatory  TOTAL TIME TAKING CARE OF THIS PATIENT: 30 minutes.   POSSIBLE D/C IN 1 DAY, DEPENDING ON CLINICAL CONDITION.   Henreitta Leber M.D on 08/28/2014 at 4:38 PM  Between 7am to 6pm - Pager - [phone]  After 6pm go to [email or web address] - password [email or web address] [facility]  [place] [facility]  Office  [phone]  CC: Primary care physician; No PCP Per Patient

## 2014-08-28 NOTE — Progress Notes (Signed)
Pt A&OX4, VSS, no c/o pain. Pt reporting an increase in appetite and requested diet to be advanced, MD notified and diet advanced. Pt tolerating well at this time, no N/V reported. No fall or injury throughout shift, plan to D/C tomorrow. Will continue to monitor.

## 2014-08-28 NOTE — Consult Note (Signed)
Pt started on full liquid diet, tol well.  I had a discussion with a GI doctor at [facility] yesterday evening and will hold on colonoscopy for 2-3 weeks and give antibiotics for that time.  After the colonoscopy and repeat CT scan may begin TNF type drug like Humira while continue antibiotics for 4 more weeks.  Patient understands and is in agreement with plan.  If doing well may go home Friday on oral antibiotics, I prefer Augmentin at this time.  Will follow up in office Thursday of next week if all goes per plan.

## 2014-08-29 MED ORDER — AMOXICILLIN-POT CLAVULANATE 875-125 MG PO TABS: 1.0000 | ORAL_TABLET | Freq: Two times a day (BID) | ORAL | Status: AC

## 2014-08-29 NOTE — Discharge Summary (Signed)
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 No pertinent past medical history.  HOSPITAL COURSE:   29 year old male with no significant past medical history except for an appendectomy who presented to the hospital due to abdominal pain and noted to have an abnormal CT scan suggestive of multiple tiny abscesses and advanced Crohn's disease.   #1 abdominal pain-this was likely secondary to the patient's Crohn's disease. -Patient was seen by gastroenterology and also surgery. As per surgery patient did not need any acute surgical intervention. -Patient was treated supportively with IV fluids, pain control, broad spectrum IV antibiotics with Zosyn. With supportive care patient's clinical symptoms have significantly improved. And his abdominal pain has resolved. Patient presently is being discharged on oral Augmentin and close follow-up with gastroenterology as an outpatient.  #2 Crohn's disease-this hasn't been officially diagnosed but the patient's CT scan was suggestive of it. -Patient will be discharged home with close follow-up with gastroenterology and patient will have a colonoscopy in the next 1-2 weeks.   DISCHARGE CONDITIONS:   Stable  CONSULTS OBTAINED:  Treatment Team:  Manya Silvas, MD  DRUG ALLERGIES:   Allergies  Allergen Reactions  . Other Anaphylaxis    Mushrooms    DISCHARGE MEDICATIONS:   Discharge Medication List as of 08/29/2014 10:53 AM    START taking these medications   Details  amoxicillin-clavulanate (AUGMENTIN) 875-125 MG per tablet Take 1 tablet by mouth 2 (two) times daily., Starting 08/29/2014, Until Discontinued, Print      CONTINUE these medications which have NOT CHANGED   Details  acetaminophen (TYLENOL) 500 MG tablet Take 1,000 mg by mouth every 6 (six) hours as needed for mild pain, moderate pain or headache., Until Discontinued, Historical Med         DISCHARGE INSTRUCTIONS:   DIET:  Regular diet  DISCHARGE CONDITION:  Stable  ACTIVITY:  Activity as tolerated  OXYGEN:  Home Oxygen: No.   Oxygen Delivery: room air  DISCHARGE LOCATION:  home   If you experience worsening of your admission symptoms, develop shortness of breath, life threatening emergency, suicidal or homicidal thoughts you must seek medical attention immediately by calling 911 or calling your MD immediately  if symptoms less severe.  You Must read complete instructions/literature along with all the possible adverse reactions/side effects for all the Medicines you take and that have been prescribed to you. Take any new Medicines after you have completely understood and accpet all the possible adverse reactions/side effects.   Please note  You were cared for by a hospitalist during your hospital stay. If you have any questions about your discharge medications or the care you received while you were in the hospital after you are discharged, you can call the unit and asked to speak with the hospitalist on call if the hospitalist that took care of you is not available. Once you are discharged, your primary care physician will handle any further medical issues. Please note that NO REFILLS for any discharge medications will be authorized once you  are discharged, as it is imperative that you return to your primary care physician (or establish a relationship with a primary care physician if you do not have one) for your aftercare needs so that they can reassess your need for  medications and monitor your lab values.     Today   Patient denies any abdominal pain, nausea, vomiting. Tolerating a soft diet well. No more hematochezia.  VITAL SIGNS:  Blood pressure 104/68, pulse 61, temperature 97.7 F (36.5 C), temperature source Oral, resp. rate 16, height 6\' 2"  (1.88 m), weight 108.092 kg (238 lb 4.8 oz), SpO2 98 %.  I/O:   Intake/Output Summary (Last 24 hours) at 08/29/14 1559 Last data filed at 08/29/14 1052  Gross per 24 hour  Intake 3528.05 ml  Output    800 ml  Net 2728.05 ml    PHYSICAL EXAMINATION:  GENERAL:  29 y.o.-year-old patient lying in the bed with no acute distress.  EYES: Pupils equal, round, reactive to light and accommodation. No scleral icterus. Extraocular muscles intact.  HEENT: Head atraumatic, normocephalic. Oropharynx and nasopharynx clear.  NECK:  Supple, no jugular venous distention. No thyroid enlargement, no tenderness.  LUNGS: Normal breath sounds bilaterally, no wheezing, rales,rhonchi. No use of accessory muscles of respiration.  CARDIOVASCULAR: S1, S2 normal. No murmurs, rubs, or gallops.  ABDOMEN: Soft, non-tender, non-distended. Bowel sounds present. No organomegaly or mass.  EXTREMITIES: No pedal edema, cyanosis, or clubbing.  NEUROLOGIC: Cranial nerves II through XII are intact. No focal motor or sensory defecits b/l.  PSYCHIATRIC: The patient is alert and oriented x 3. Good affect.  SKIN: No obvious rash, lesion, or ulcer.   DATA REVIEW:   CBC  Recent Labs Lab 08/26/14 0430  WBC 7.0  HGB 10.1*  HCT 31.7*  PLT 312    Chemistries   Recent Labs Lab 08/24/14 1127 08/26/14 0430  NA 138 138  K 4.2 4.3  CL 103 104  CO2 26 25  GLUCOSE 100* 79  BUN 11 7  CREATININE 0.98 0.85  CALCIUM 9.3 8.6*  AST 17  --   ALT 22  --   ALKPHOS 115  --   BILITOT 0.8  --     Cardiac Enzymes No results for input(s): TROPONINI in the last 168 hours.  Microbiology Results  No results found for this or any  previous visit.  RADIOLOGY:  No results found.    Management plans discussed with the patient, family and they are in agreement.  CODE STATUS:   TOTAL TIME TAKING CARE OF THIS PATIENT: 40 minutes.    Henreitta Leber M.D on 08/29/2014 at 3:59 PM  Between 7am to 6pm - Pager - [phone]  After 6pm go to [email or web address] - password [email or web address] [facility]  [place] [facility]  Office  [phone]  CC: Primary care physician; No PCP Per Patient

## 2014-08-29 NOTE — Care Management (Signed)
Spoke with patient for discharge planning. AO form home no insurance. MD will discharge today on Augmentin 875. Coupon given for medication at [facility] which will cost patient $20. Patient has income about threshold for charitable foundation.  Patient states that he can afford discharge medications.Gave information sheet on [facility] for PCP. Patient agrees to establish appointment with MD. Patient will have follow up appointment for colonoscopy in 2 weeks. No other CM needs identified.

## 2014-08-29 NOTE — Progress Notes (Signed)
Patient is tolerating diet well, no nausea , no complaints of pain, vital signs stable, alert and oriented, no distress.  Discharge instructions given, patient verbalized understanding.

## 2014-08-29 NOTE — Discharge Instructions (Signed)

## 2014-08-29 NOTE — Progress Notes (Signed)
[place] was admitted to the Hospital on 08/24/2014 and Discharged  08/29/2014 and should be excused from work/school   for 4 days starting 08/29/2014 , may return to work/school without any restrictions.  Call Abel Presto MD, [facility]  [phone] with questions.  Henreitta Leber M.D on 08/29/2014,at 10:43 AM

## 2014-09-10 ENCOUNTER — Other Ambulatory Visit: Payer: Self-pay | Admitting: Physician Assistant

## 2014-09-10 DIAGNOSIS — R1031 Right lower quadrant pain: Secondary | ICD-10-CM

## 2014-09-19 ENCOUNTER — Ambulatory Visit: Payer: MEDICAID

## 2014-09-20 ENCOUNTER — Ambulatory Visit
Admission: RE | Admit: 2014-09-20 | Discharge: 2014-09-20 | Disposition: A | Discharge status: Home / Self Care | Payer: Self-pay | Source: Ambulatory Visit | Attending: Physician Assistant | Admitting: Physician Assistant

## 2014-09-20 DIAGNOSIS — R188 Other ascites: Secondary | ICD-10-CM | POA: Insufficient documentation

## 2014-09-20 DIAGNOSIS — R1031 Right lower quadrant pain: Secondary | ICD-10-CM

## 2014-09-20 DIAGNOSIS — K929 Disease of digestive system, unspecified: Secondary | ICD-10-CM | POA: Insufficient documentation

## 2014-09-20 MED ORDER — IOHEXOL 350 MG/ML SOLN
100.0000 mL | Freq: Once | INTRAVENOUS | Status: AC | PRN
  Administered 2014-09-20: 100 mL via INTRAVENOUS

## 2014-09-26 ENCOUNTER — Ambulatory Visit: Payer: MEDICAID | Admitting: Anesthesiology

## 2014-09-26 ENCOUNTER — Encounter: Payer: Self-pay | Admitting: Anesthesiology

## 2014-09-26 ENCOUNTER — Encounter
Admission: RE | Disposition: A | Discharge status: Home / Self Care | Payer: Self-pay | Source: Ambulatory Visit | Attending: Unknown Physician Specialty

## 2014-09-26 ENCOUNTER — Ambulatory Visit: Payer: Self-pay | Admitting: Anesthesiology

## 2014-09-26 ENCOUNTER — Ambulatory Visit
Admission: RE | Admit: 2014-09-26 | Discharge: 2014-09-26 | Disposition: A | Discharge status: Home / Self Care | Payer: Self-pay | Source: Ambulatory Visit | Attending: Unknown Physician Specialty | Admitting: Unknown Physician Specialty

## 2014-09-26 DIAGNOSIS — K640 First degree hemorrhoids: Secondary | ICD-10-CM | POA: Insufficient documentation

## 2014-09-26 DIAGNOSIS — Z79899 Other long term (current) drug therapy: Secondary | ICD-10-CM | POA: Insufficient documentation

## 2014-09-26 DIAGNOSIS — R1031 Right lower quadrant pain: Secondary | ICD-10-CM | POA: Insufficient documentation

## 2014-09-26 DIAGNOSIS — R51 Headache: Secondary | ICD-10-CM | POA: Insufficient documentation

## 2014-09-26 DIAGNOSIS — D120 Benign neoplasm of cecum: Secondary | ICD-10-CM | POA: Insufficient documentation

## 2014-09-26 HISTORY — PX: COLONOSCOPY WITH PROPOFOL: SHX5780

## 2014-09-26 SURGERY — COLONOSCOPY WITH PROPOFOL
Anesthesia: General

## 2014-09-26 MED ORDER — PROPOFOL INFUSION 10 MG/ML OPTIME
INTRAVENOUS | Status: DC | PRN
  Administered 2014-09-26: 160 ug/kg/min via INTRAVENOUS

## 2014-09-26 MED ORDER — SODIUM CHLORIDE 0.9 % IV SOLN
INTRAVENOUS | Status: DC
  Administered 2014-09-26: 1000 mL via INTRAVENOUS

## 2014-09-26 MED ORDER — MIDAZOLAM HCL 5 MG/5ML IJ SOLN
INTRAMUSCULAR | Status: DC | PRN
  Administered 2014-09-26: 2 mg via INTRAVENOUS

## 2014-09-26 MED ORDER — ALFENTANIL 500 MCG/ML IJ INJ
INJECTION | INTRAMUSCULAR | Status: DC | PRN
  Administered 2014-09-26 (×2): 250 ug via INTRAVENOUS

## 2014-09-26 MED ORDER — SODIUM CHLORIDE 0.9 % IV SOLN: INTRAVENOUS | Status: DC

## 2014-09-26 MED ORDER — PROPOFOL 10 MG/ML IV BOLUS
INTRAVENOUS | Status: DC | PRN
  Administered 2014-09-26: 70 mg via INTRAVENOUS

## 2014-09-26 MED ORDER — PHENYLEPHRINE HCL 10 MG/ML IJ SOLN
INTRAMUSCULAR | Status: DC | PRN
  Administered 2014-09-26: 150 ug via INTRAVENOUS

## 2014-09-26 NOTE — H&P (Signed)
   Primary Care Physician:  No PCP Per Patient Primary Gastroenterologist:  Dr. Vira Agar  Pre-Procedure History & Physical: HPI:  Tony Odonnell is a 29 y.o. male is here for an colonoscopy. This is for follow up abnormal CT scan suggesting significant Crohn's disease.   History reviewed. No pertinent past medical history.  Past Surgical History  Procedure Laterality Date  . Appendectomy    Drainage of abcesses done from gluteal approach in conjunction with the appendectomy.  Prior to Admission medications   Medication Sig Start Date End Date Taking? Authorizing Provider  amoxicillin-clavulanate (AUGMENTIN) 875-125 MG per tablet Take 1 tablet by mouth 2 (two) times daily. 08/29/14  Yes Henreitta Leber, MD  acetaminophen (TYLENOL) 500 MG tablet Take 1,000 mg by mouth every 6 (six) hours as needed for mild pain, moderate pain or headache.    Historical Provider, MD    Allergies as of 09/10/2014 - Review Complete 08/24/2014  Allergen Reaction Noted  . Other Anaphylaxis 08/24/2014    History reviewed. No pertinent family history.  Social History   Social History  . Marital Status: Single    Spouse Name: N/A  . Number of Children: N/A  . Years of Education: N/A   Occupational History  . Not on file.   Social History Main Topics  . Smoking status: Never Smoker   . Smokeless tobacco: Not on file  . Alcohol Use: No  . Drug Use: Not on file  . Sexual Activity: Not on file   Other Topics Concern  . Not on file   Social History Narrative    Review of Systems: See HPI, otherwise negative ROS  Physical Exam: BP 124/81 mmHg  Pulse 108  Temp(Src) 96.4 F (35.8 C) (Tympanic)  Resp 17  Ht 6\' 1"  (1.854 m)  Wt 106.142 kg (234 lb)  BMI 30.88 kg/m2  SpO2 100% General:   Alert,  pleasant and cooperative in NAD Head:  Normocephalic and atraumatic. Neck:  Supple; no masses or thyromegaly. Lungs:  Clear throughout to auscultation.    Heart:  Regular rate and  rhythm. Abdomen:  Soft, nontender and nondistended. Normal bowel sounds, without guarding, and without rebound.   Neurologic:  Alert and  oriented x4;  grossly normal neurologically.  Impression/Plan: Tony Odonnell is here for an colonoscopy to be performed for Crohn's disease  Risks, benefits, limitations, and alternatives regarding  colonoscopy have been reviewed with the patient.  Questions have been answered.  All parties agreeable.   Gaylyn Cheers, MD  09/26/2014, 4:16 PM

## 2014-09-26 NOTE — Transfer of Care (Signed)
Immediate Anesthesia Transfer of Care Note  Patient: Tony Odonnell  Procedure(s) Performed: Procedure(s): COLONOSCOPY WITH PROPOFOL (N/A)  Patient Location: PACU  Anesthesia Type:General  Level of Consciousness: awake and patient cooperative  Airway & Oxygen Therapy: Patient Spontanous Breathing and Patient connected to nasal cannula oxygen  Post-op Assessment: Report given to RN and Post -op Vital signs reviewed and stable  Post vital signs: Reviewed and stable  Last Vitals:  Filed Vitals:   09/26/14 1648  BP: 112/68  Pulse:   Temp: 36.1 C  Resp: 16    Complications: No apparent anesthesia complications

## 2014-09-26 NOTE — Op Note (Addendum)
[facility] Gastroenterology Patient Name: Shaw Dobek Procedure Date: 09/26/2014 3:45 PM MRN: [id number] Account #: [id number] Date of Birth: September 12, 1985 Admit Type: Outpatient Age: 29 Room: [facility] ENDO ROOM 1 Gender: Male Note Status: Supervisor Override Procedure:         Colonoscopy Indications:       Abnormal CT of the GI tract Providers:         Manya Silvas, MD Medicines:         Propofol per Anesthesia Complications:     No immediate complications. Procedure:         Pre-Anesthesia Assessment:                    - After reviewing the risks and benefits, the patient was                     deemed in satisfactory condition to undergo the procedure.                    After obtaining informed consent, the colonoscope was                     passed under direct vision. Throughout the procedure, the                     patient's blood pressure, pulse, and oxygen saturations                     were monitored continuously. The Colonoscope was                     introduced through the anus and advanced to the the                     ileocecal valve. The colonoscopy was performed without                     difficulty. The patient tolerated the procedure well. The                     quality of the bowel preparation was excellent. Findings:      Internal hemorrhoids were found during digital exam. The hemorrhoids       were small-medium-sized and Grade I (internal hemorrhoids that do not       prolapse).      The ileocecal valve contained one semi-pedunculated, non-bleeding polyp.       The polyp was medium in diameter. Biopsies were taken with a cold       forceps for histology. Due to altered anatomy I did not do ileal       intubation. Impression:        - Internal hemorrhoids.                    - One ileal polyp in the ileocecal valve. Biopsied. Recommendation:    - Await pathology results. Consider MRI of small bowel                     versus CT  enterography. Consider TNF drug, continue                     antibiotics for now. Manya Silvas, MD 09/26/2014 4:51:44 PM This report has been signed electronically. Number of Addenda: 0 Note Initiated On: 09/26/2014 3:45  PM Scope Withdrawal Time: 0 hours 10 minutes 41 seconds  Total Procedure Duration: 0 hours 18 minutes 45 seconds       [facility]

## 2014-09-26 NOTE — Anesthesia Preprocedure Evaluation (Signed)
Anesthesia Evaluation  Patient identified by MRN, date of birth, ID band Patient awake    Reviewed: Allergy & Precautions, H&P , NPO status , Patient's Chart, lab work & pertinent test results, reviewed documented beta blocker date and time   History of Anesthesia Complications Negative for: history of anesthetic complications  Airway Mallampati: II  TM Distance: >3 FB Neck ROM: full    Dental no notable dental hx.    Pulmonary neg pulmonary ROS,    Pulmonary exam normal breath sounds clear to auscultation       Cardiovascular Exercise Tolerance: Good negative cardio ROS Normal cardiovascular exam Rhythm:regular Rate:Normal     Neuro/Psych negative neurological ROS  negative psych ROS   GI/Hepatic negative GI ROS, Neg liver ROS,   Endo/Other  negative endocrine ROS  Renal/GU negative Renal ROS  negative genitourinary   Musculoskeletal   Abdominal   Peds  Hematology negative hematology ROS (+)   Anesthesia Other Findings History reviewed. No pertinent past medical history.   Reproductive/Obstetrics negative OB ROS                             Anesthesia Physical Anesthesia Plan  ASA: I  Anesthesia Plan: General   Post-op Pain Management:    Induction:   Airway Management Planned:   Additional Equipment:   Intra-op Plan:   Post-operative Plan:   Informed Consent: I have reviewed the patients History and Physical, chart, labs and discussed the procedure including the risks, benefits and alternatives for the proposed anesthesia with the patient or authorized representative who has indicated his/her understanding and acceptance.   Dental Advisory Given  Plan Discussed with: Anesthesiologist, CRNA and Surgeon  Anesthesia Plan Comments:         Anesthesia Quick Evaluation

## 2014-09-27 NOTE — Anesthesia Postprocedure Evaluation (Signed)
  Anesthesia Post-op Note  Patient: Tony Odonnell  Procedure(s) Performed: Procedure(s): COLONOSCOPY WITH PROPOFOL (N/A)  Anesthesia type:General  Patient location: PACU  Post pain: Pain level controlled  Post assessment: Post-op Vital signs reviewed, Patient's Cardiovascular Status Stable, Respiratory Function Stable, Patent Airway and No signs of Nausea or vomiting  Post vital signs: Reviewed and stable  Last Vitals:  Filed Vitals:   09/26/14 1720  BP: 117/78  Pulse: 77  Temp:   Resp: 17    Level of consciousness: awake, alert  and patient cooperative  Complications: No apparent anesthesia complications

## 2014-09-28 ENCOUNTER — Encounter: Payer: Self-pay | Admitting: Unknown Physician Specialty

## 2014-09-30 LAB — SURGICAL PATHOLOGY

## 2017-01-11 IMAGING — CR DG ABDOMEN 2V
2 series · 3 of 3 positions shown · non-contrast
Comparison: None.

CLINICAL DATA: Right lower quadrant pain for 2 weeks.

EXAM:
ABDOMEN - 2 VIEW

[abdomen erect (1 of 2)]
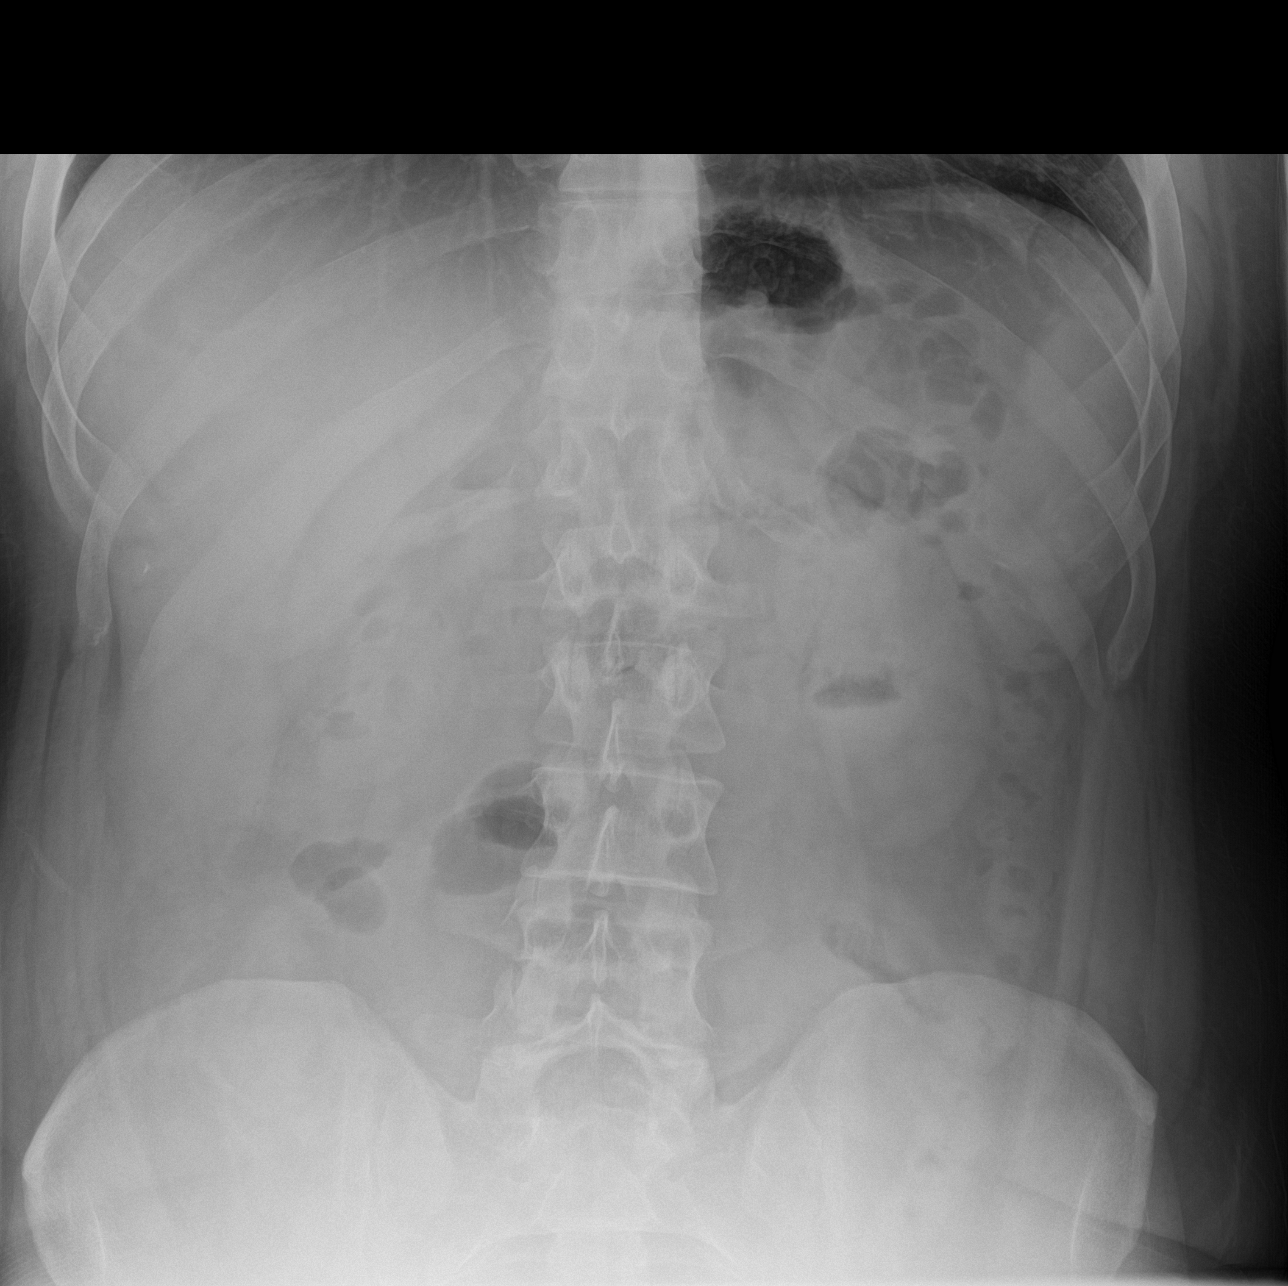

[Series 2: abdomen erect · 0.14mm/px · 2 of 2 slices shown (2 of 2)]
[im 1/2]
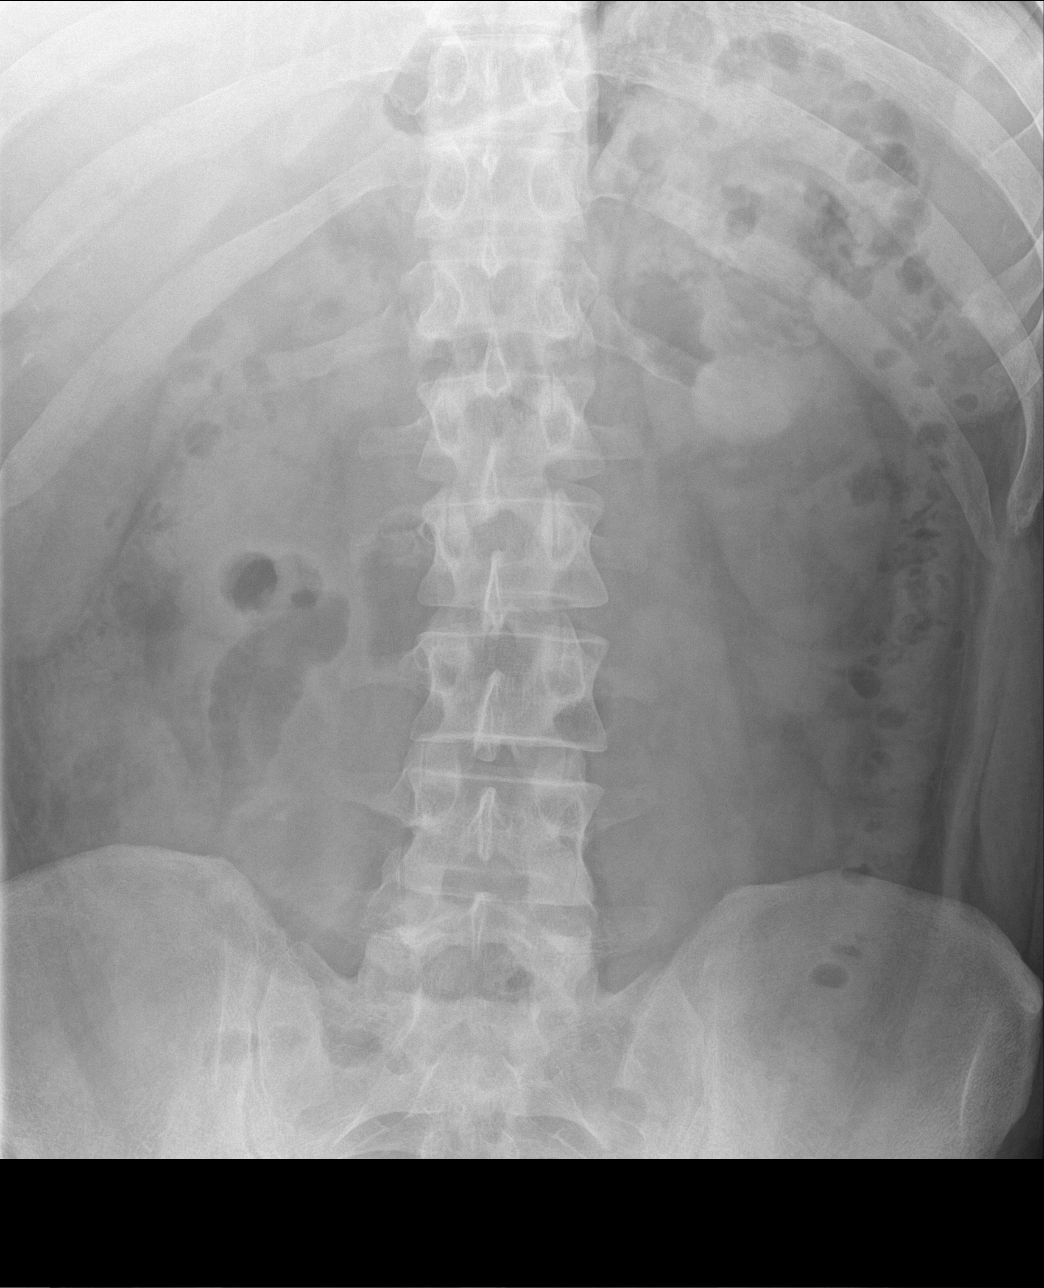
[im 2/2]
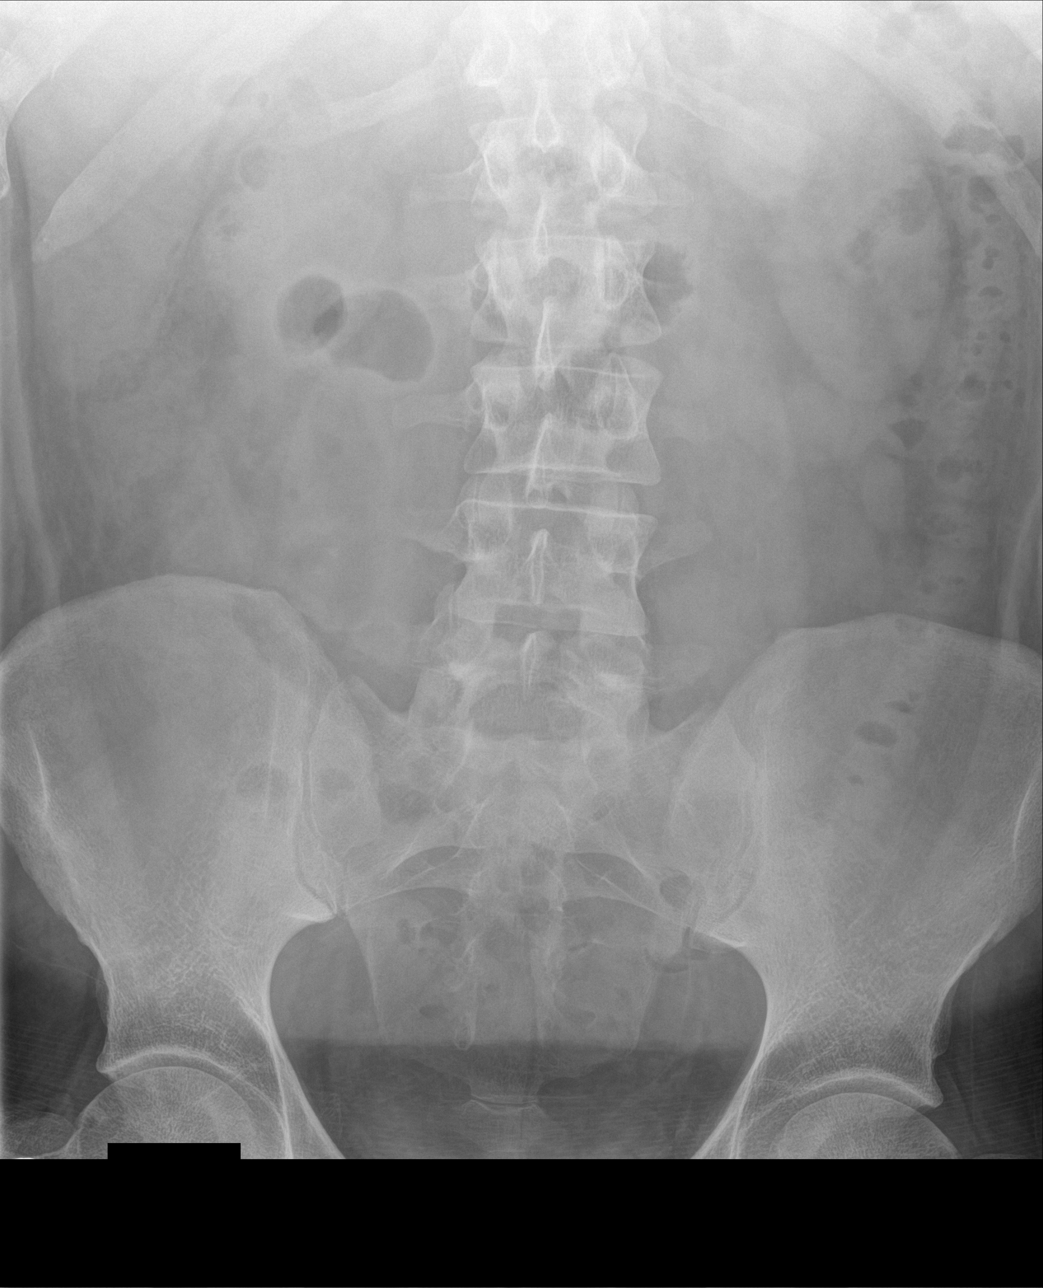

[3 of 3 positions shown; findings below may reference images not displayed]

FINDINGS: There is a triangular 4 mm calcification just below the level of the
right L4 transverse process. This could reside within the right
ureter.

The abdominal gas pattern is normal.
IMPRESSION: Question right ureterolithiasis, with a triangular 4 mm
calcification near the right L4 transverse process.

## 2021-06-13 ENCOUNTER — Emergency Department: Payer: BC Managed Care – PPO

## 2021-06-13 ENCOUNTER — Emergency Department
Admission: EM | Admit: 2021-06-13 | Discharge: 2021-06-14 | Disposition: A | Discharge status: Home / Self Care | Payer: BC Managed Care – PPO | Attending: Emergency Medicine | Admitting: Emergency Medicine

## 2021-06-13 ENCOUNTER — Other Ambulatory Visit: Payer: Self-pay

## 2021-06-13 DIAGNOSIS — N202 Calculus of kidney with calculus of ureter: Secondary | ICD-10-CM | POA: Insufficient documentation

## 2021-06-13 DIAGNOSIS — R1032 Left lower quadrant pain: Secondary | ICD-10-CM | POA: Diagnosis present

## 2021-06-13 DIAGNOSIS — N23 Unspecified renal colic: Secondary | ICD-10-CM

## 2021-06-13 DIAGNOSIS — N200 Calculus of kidney: Secondary | ICD-10-CM

## 2021-06-13 LAB — CBC
HCT: 46.5 % (ref 39.0–52.0)
Hemoglobin: 16 g/dL (ref 13.0–17.0)
MCH: 30.2 pg (ref 26.0–34.0)
MCHC: 34.4 g/dL (ref 30.0–36.0)
MCV: 87.9 fL (ref 80.0–100.0)
Platelets: 195 10*3/uL (ref 150–400)
RBC: 5.29 MIL/uL (ref 4.22–5.81)
RDW: 13.2 % (ref 11.5–15.5)
WBC: 13.3 10*3/uL — ABNORMAL HIGH (ref 4.0–10.5)
nRBC: 0 % (ref 0.0–0.2)

## 2021-06-13 LAB — BASIC METABOLIC PANEL
Anion gap: 6 (ref 5–15)
BUN: 16 mg/dL (ref 6–20)
CO2: 21 mmol/L — ABNORMAL LOW (ref 22–32)
Calcium: 9.3 mg/dL (ref 8.9–10.3)
Chloride: 112 mmol/L — ABNORMAL HIGH (ref 98–111)
Creatinine, Ser: 1.54 mg/dL — ABNORMAL HIGH (ref 0.61–1.24)
GFR, Estimated: 59 mL/min — ABNORMAL LOW (ref >60)
Glucose, Bld: 129 mg/dL — ABNORMAL HIGH (ref 70–99)
Potassium: 4.1 mmol/L (ref 3.5–5.1)
Sodium: 139 mmol/L (ref 135–145)

## 2021-06-13 MED ORDER — SODIUM CHLORIDE 0.9 % IV BOLUS
1000.0000 mL | Freq: Once | INTRAVENOUS | Status: AC
  Administered 2021-06-14: 1000 mL via INTRAVENOUS

## 2021-06-13 MED ORDER — KETOROLAC TROMETHAMINE 15 MG/ML IJ SOLN
15.0000 mg | Freq: Once | INTRAMUSCULAR | Status: AC
  Administered 2021-06-14: 15 mg via INTRAVENOUS
  Filled 2021-06-13: qty 1

## 2021-06-13 MED ORDER — FENTANYL CITRATE PF 50 MCG/ML IJ SOSY
50.0000 ug | PREFILLED_SYRINGE | INTRAMUSCULAR | Status: DC | PRN
  Administered 2021-06-13: 50 ug via INTRAVENOUS
  Filled 2021-06-13: qty 1

## 2021-06-13 MED ORDER — ONDANSETRON HCL 4 MG/2ML IJ SOLN
4.0000 mg | Freq: Once | INTRAMUSCULAR | Status: AC
  Administered 2021-06-13: 4 mg via INTRAVENOUS
  Filled 2021-06-13: qty 2

## 2021-06-13 MED ORDER — TAMSULOSIN HCL 0.4 MG PO CAPS
0.4000 mg | ORAL_CAPSULE | Freq: Once | ORAL | Status: AC
  Administered 2021-06-14: 0.4 mg via ORAL
  Filled 2021-06-13: qty 1

## 2021-06-13 NOTE — ED Triage Notes (Signed)
Pt presents to ER c/o left flank pain that started suddenly a few hours ago.  Pt denies hx of kidney stones.  Pt denies urinary problems.  Pt denies radiation of pain at this time.  Pt is A&O x4 at this time, and appears uncomfortable in triage.

## 2021-06-13 NOTE — ED Notes (Signed)
Pt states that when pain comes, it feels like "a charlie horse inside of my kidney and there's no good position." Pt states that when pain was at worst, had N/V.

## 2021-06-14 LAB — URINALYSIS, ROUTINE W REFLEX MICROSCOPIC
Bilirubin Urine: NEGATIVE
Glucose, UA: NEGATIVE mg/dL
Ketones, ur: 5 mg/dL — AB
Leukocytes,Ua: NEGATIVE
Nitrite: NEGATIVE
Protein, ur: 30 mg/dL — AB
RBC / HPF: >50 RBC/hpf — ABNORMAL HIGH (ref 0–5)
Specific Gravity, Urine: 1.03 (ref 1.005–1.030)
WBC, UA: >50 WBC/hpf — ABNORMAL HIGH (ref 0–5)
pH: 5 (ref 5.0–8.0)

## 2021-06-14 MED ORDER — OXYCODONE-ACETAMINOPHEN 5-325 MG PO TABS: 1.0000 | ORAL_TABLET | ORAL | 0 refills | Status: AC | PRN

## 2021-06-14 MED ORDER — TAMSULOSIN HCL 0.4 MG PO CAPS: 0.4000 mg | ORAL_CAPSULE | Freq: Every day | ORAL | 0 refills | Status: AC

## 2021-06-14 MED ORDER — IBUPROFEN 800 MG PO TABS: 800.0000 mg | ORAL_TABLET | Freq: Three times a day (TID) | ORAL | 0 refills | Status: AC | PRN

## 2021-06-14 MED ORDER — ONDANSETRON 4 MG PO TBDP: 4.0000 mg | ORAL_TABLET | Freq: Three times a day (TID) | ORAL | 0 refills | Status: AC | PRN

## 2021-06-14 NOTE — Discharge Instructions (Signed)
You have been seen in the Emergency Department (ED)  Today and was diagnosed with kidney stones. While the stone is traveling through the ureter, which is the tube that carries urine from the kidney to the bladder, you will probably feel pain. The pain may be mild or very severe. You may also have some blood in your urine. As soon as the stone reaches the bladder, any intense pain should go away. If a stone is too large to pass on its own, you may need a medical procedure to help you pass the stone.   As we have discussed, please drink plenty of fluids and use a urinary strainer to attempt to capture the stone.  Please make a follow up appointment with Urology in the next week by calling the number below and bring the stone with you.  Pain control: Take ibuprofen '800mg'$  every 6 hours for the pain. If the pain is not well controlled with ibuprofen you may take one percocet every 4 hours. Do not take tylenol while taking percocet. Please also take your prescribed flomax daily.   Follow-up with your doctor or return to the ER in 12-24 hours if your pain is not well controlled, if you develop pain or burning with urination, or if you develop a fever. Otherwise follow up in 3-5 days with your doctor.  When should you call for help?  Call your doctor now or seek immediate medical care if:  You cannot keep down fluids.  Your pain gets worse.  You have a fever or chills.  You have new or worse pain in your back just below your rib cage (the flank area).  You have new or more blood in your urine. You have pain or burning with urination You are unable to urinate You have abdominal pain  Watch closely for changes in your health, and be sure to contact your doctor if:  You do not get better as expected  How can you care for yourself at home?  Drink plenty of fluids, enough so that your urine is light yellow or clear like water. If you have kidney, heart, or liver disease and have to limit fluids, talk with  your doctor before you increase the amount of fluids you drink.  Take pain medicines exactly as directed. Call your doctor if you think you are having a problem with your medicine.  If the doctor gave you a prescription medicine for pain, take it as prescribed.  If you are not taking a prescription pain medicine, ask your doctor if you can take an over-the-counter medicine. Read and follow all instructions on the label. Your doctor may ask you to strain your urine so that you can collect your kidney stone when it passes. You can use a kitchen strainer or a tea strainer to catch the stone. Store it in a plastic bag until you see your doctor again.  Preventing future kidney stones  Some changes in your diet may help prevent kidney stones. Depending on the cause of your stones, your doctor may recommend that you:  Drink plenty of fluids, enough so that your urine is light yellow or clear like water. If you have kidney, heart, or liver disease and have to limit fluids, talk with your doctor before you increase the amount of fluids you drink.  Limit coffee, tea, and alcohol. Also avoid grapefruit juice.  Do not take more than the recommended daily dose of vitamins C and D.  Avoid antacids such as Gaviscon,  Maalox, Mylanta, or Tums.  Limit the amount of salt (sodium) in your diet.  Eat a balanced diet that is not too high in protein.  Limit foods that are high in a substance called oxalate, which can cause kidney stones. These foods include dark green vegetables, rhubarb, chocolate, wheat bran, nuts, cranberries, and beans.

## 2021-06-14 NOTE — ED Provider Notes (Signed)
[facility] Provider Note    Event Date/Time   First MD Initiated Contact with Patient 06/13/21 2326     (approximate)   History   Flank Pain   HPI  Tony Odonnell is a 36 y.o. male with history of Crohn's disease who presents for evaluation of left side abdominal pain.  Patient reports sudden onset of severe sharp to left flank pain radiating to his lower abdomen associated with nausea and a couple episodes of nonbloody nonbilious emesis.  Patient denies any similar pains in the past.  No dysuria or hematuria, no chest pain or shortness of breath, no diarrhea or constipation.  The pain was severe when it started.     History reviewed. No pertinent past medical history.  Past Surgical History:  Procedure Laterality Date   APPENDECTOMY     COLONOSCOPY WITH PROPOFOL N/A 09/26/2014   Procedure: COLONOSCOPY WITH PROPOFOL;  Surgeon: Manya Silvas, MD;  Location: [facility] ENDOSCOPY;  Service: Endoscopy;  Laterality: N/A;     Physical Exam   Triage Vital Signs: ED Triage Vitals  Enc Vitals Group     BP 06/13/21 2035 (!) 144/100     Pulse Rate 06/13/21 2035 63     Resp 06/13/21 2035 (!) 22     Temp 06/13/21 2035 97.7 F (36.5 C)     Temp Source 06/13/21 2035 Oral     SpO2 06/13/21 2035 96 %     Weight 06/13/21 2034 250 lb (113.4 kg)     Height 06/13/21 2034 '6\' 1"'$  (1.854 m)     Head Circumference --      Peak Flow --      Pain Score 06/13/21 2037 10     Pain Loc --      Pain Edu? --      Excl. in [place]? --     Most recent vital signs: Vitals:   06/13/21 2035 06/13/21 2322  BP: (!) 144/100 128/79  Pulse: 63 89  Resp: (!) 22 18  Temp: 97.7 F (36.5 C)   SpO2: 96% 100%     Constitutional: Alert and oriented. Well appearing and in no apparent distress. HEENT:      Head: Normocephalic and atraumatic.         Eyes: Conjunctivae are normal. Sclera is non-icteric.       Mouth/Throat: Mucous membranes are moist.       Neck: Supple with no signs  of meningismus. Cardiovascular: Regular rate and rhythm. No murmurs, gallops, or rubs. 2+ symmetrical distal pulses are present in all extremities.  Respiratory: Normal respiratory effort. Lungs are clear to auscultation bilaterally.  Gastrointestinal: Soft, non tender, and non distended with positive bowel sounds. No rebound or guarding. Genitourinary: No CVA tenderness. Musculoskeletal:  No edema, cyanosis, or erythema of extremities. Neurologic: Normal speech and language. Face is symmetric. Moving all extremities. No gross focal neurologic deficits are appreciated. Skin: Skin is warm, dry and intact. No rash noted. Psychiatric: Mood and affect are normal. Speech and behavior are normal.  ED Results / Procedures / Treatments   Labs (all labs ordered are listed, but only abnormal results are displayed) Labs Reviewed  CBC - Abnormal; Notable for the following components:      Result Value   WBC 13.3 (*)    All other components within normal limits  BASIC METABOLIC PANEL - Abnormal; Notable for the following components:   Chloride 112 (*)    CO2 21 (*)    Glucose,  Bld 129 (*)    Creatinine, Ser 1.54 (*)    GFR, Estimated 59 (*)    All other components within normal limits  URINALYSIS, ROUTINE W REFLEX MICROSCOPIC     EKG  none   RADIOLOGY I, Rudene Re, attending MD, have personally viewed and interpreted the images obtained during this visit as below:  CT showing a 3 mm left distal ureteral stone   ___________________________________________________ Interpretation by Radiologist:  CT Renal Stone Study  Result Date: 06/13/2021 CLINICAL DATA:  Left flank pain. EXAM: CT ABDOMEN AND PELVIS WITHOUT CONTRAST TECHNIQUE: Multidetector CT imaging of the abdomen and pelvis was performed following the standard protocol without IV contrast. RADIATION DOSE REDUCTION: This exam was performed according to the departmental dose-optimization program which includes automated  exposure control, adjustment of the mA and/or kV according to patient size and/or use of iterative reconstruction technique. COMPARISON:  September 20, 2014 FINDINGS: Lower chest: No acute abnormality. Hepatobiliary: No focal liver abnormality is seen. No gallstones, gallbladder wall thickening, or biliary dilatation. Pancreas: Unremarkable. No pancreatic ductal dilatation or surrounding inflammatory changes. Spleen: Normal in size without focal abnormality. Adrenals/Urinary Tract: Adrenal glands are unremarkable. Kidneys are normal in size, without focal lesions. A 3 mm obstructing renal calculus is seen within the distal left ureter. There is moderate severity left-sided hydronephrosis and hydroureter with mild left perinephric inflammatory fat stranding. Bladder is unremarkable. Stomach/Bowel: Stomach is within normal limits. Surgically anastomosed bowel is seen within the anterior aspect of the mid left abdomen. Surgical sutures are also seen within region of the cecum. No evidence of bowel wall thickening, distention, or inflammatory changes. Vascular/Lymphatic: No significant vascular findings are present. No enlarged abdominal or pelvic lymph nodes. Reproductive: Prostate is unremarkable. Other: No abdominal wall hernia or abnormality. No abdominopelvic ascites. Musculoskeletal: No acute or significant osseous findings. IMPRESSION: 3 mm obstructing renal calculus within the distal left ureter. Electronically Signed   By: Virgina Norfolk M.D.   On: 06/13/2021 21:08       PROCEDURES:  Critical Care performed: No  Procedures    IMPRESSION / MDM / ASSESSMENT AND PLAN / ED COURSE  I reviewed the triage vital signs and the nursing notes.  36 y.o. male with history of Crohn's disease who presents for evaluation of left side abdominal pain.  On exam is well-appearing in no distress with normal vital signs, abdomen soft and no significant tenderness.  Ddx: Kidney stone versus pyelonephritis versus  UTI versus diverticulitis versus Crohn's flare versus appendicitis   Plan: CBC, BMP, urinalysis, CT abdomen pelvis.  Will give IV fentanyl and Zofran for symptom relief   MEDICATIONS GIVEN IN ED: Medications  fentaNYL (SUBLIMAZE) injection 50 mcg (50 mcg Intravenous Given 06/13/21 2045)  ondansetron (ZOFRAN) injection 4 mg (4 mg Intravenous Given 06/13/21 2045)  ketorolac (TORADOL) 15 MG/ML injection 15 mg (15 mg Intravenous Given 06/14/21 0003)  tamsulosin (FLOMAX) capsule 0.4 mg (0.4 mg Oral Given 06/14/21 0003)  sodium chloride 0.9 % bolus 1,000 mL (1,000 mLs Intravenous New Bag/Given 06/14/21 0002)     ED COURSE: CT consistent with a left-sided ureteral stone.  UA with no overlying infection.  Patient does have a slightly elevated creatinine at 1.54.  Review of records shows the patient's baseline is usually 1.  He received fluids for that.  He has a normal functioning kidney on the right side as well.  Pain is well controlled after IV fentanyl, Toradol, Zofran, and Flomax.  We discussed dietary changes and follow-up with urology.  We discussed pain control at home.  We discussed my standard return precautions.   Consults: None   EMR reviewed including last visit with patient's GI doctor from January 2022 for his Crohn's disease    FINAL CLINICAL IMPRESSION(S) / ED DIAGNOSES   Final diagnoses:  Kidney stone  Renal colic     Rx / DC Orders   ED Discharge Orders          Ordered    ibuprofen (ADVIL) 800 MG tablet  Every 8 hours PRN        06/14/21 0015    oxyCODONE-acetaminophen (PERCOCET) 5-325 MG tablet  Every 4 hours PRN        06/14/21 0015    ondansetron (ZOFRAN-ODT) 4 MG disintegrating tablet  Every 8 hours PRN        06/14/21 0015    tamsulosin (FLOMAX) 0.4 MG CAPS capsule  Daily        06/14/21 0015             Note:  This document was prepared using Dragon voice recognition software and may include unintentional dictation errors.   Please note:  Patient was  evaluated in Emergency Department today for the symptoms described in the history of present illness. Patient was evaluated in the context of the global COVID-19 pandemic, which necessitated consideration that the patient might be at risk for infection with the SARS-CoV-2 virus that causes COVID-19. Institutional protocols and algorithms that pertain to the evaluation of patients at risk for COVID-19 are in a state of rapid change based on information released by regulatory bodies including the CDC and federal and state organizations. These policies and algorithms were followed during the patient's care in the ED.  Some ED evaluations and interventions may be delayed as a result of limited staffing during the pandemic.       Alfred Levins, [place], MD 06/14/21 [phone]

## 2021-06-15 LAB — URINE CULTURE: Culture: NO GROWTH

## 2023-11-17 IMAGING — CT CT RENAL STONE PROTOCOL
2 of 4 series · 16 of 46 positions shown, 18 images · non-contrast
Comparison: September 20, 2014

CLINICAL DATA: Left flank pain.



[Series 2: stone full standard · axial · 0.82mm/px · z∈[-1131,-566]mm · 13 of 123 slices shown, 15 images]
[im 5/123  soft-tissue]
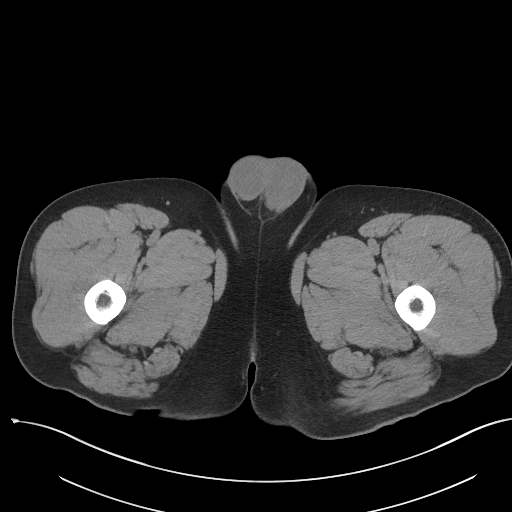
[im 5/123  bone]
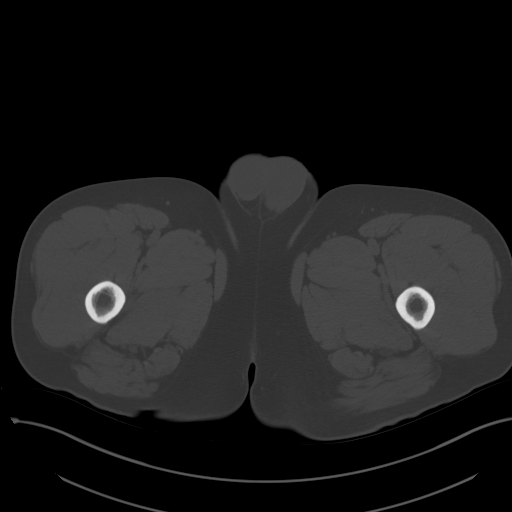
[im 15/123  soft-tissue]
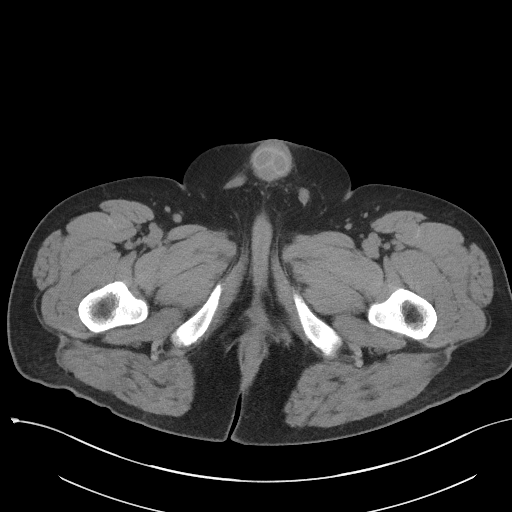
[im 25/123  soft-tissue]
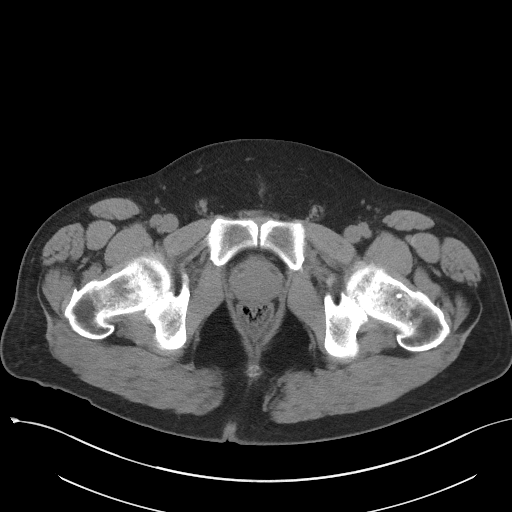
[im 35/123  soft-tissue]
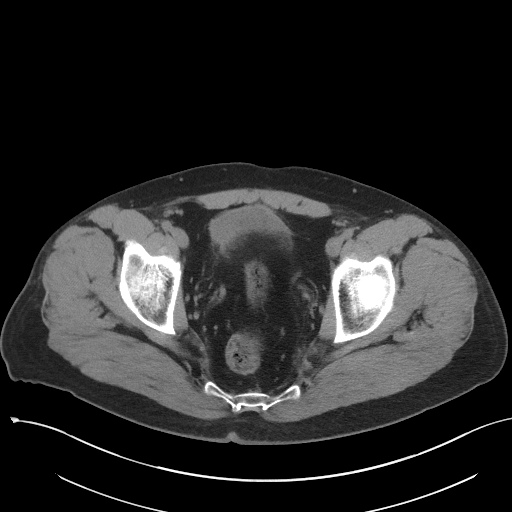
[im 44/123  soft-tissue]
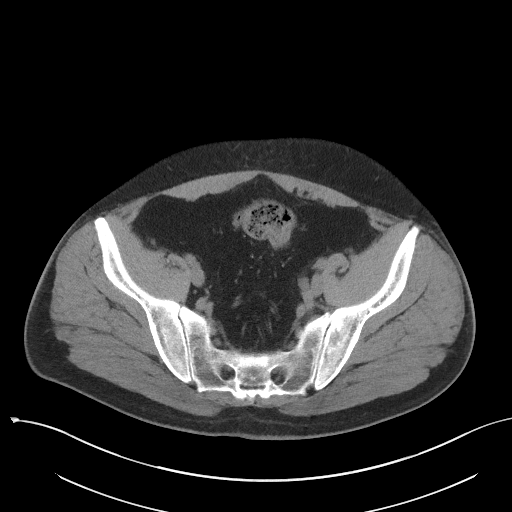
[im 54/123  soft-tissue]
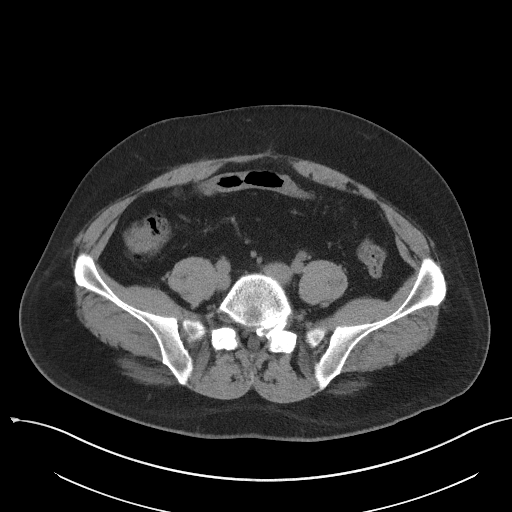
[im 64/123  soft-tissue]
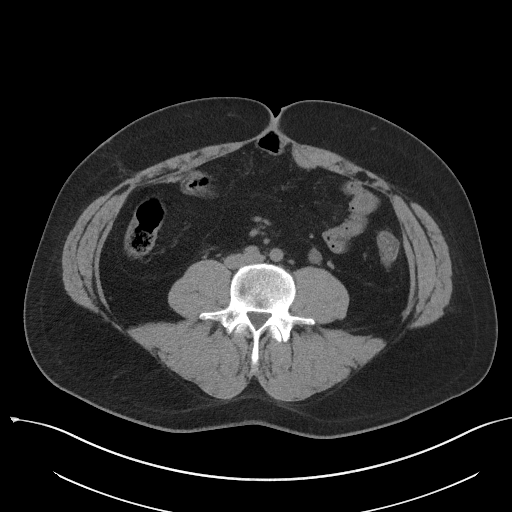
[im 69/123  soft-tissue]
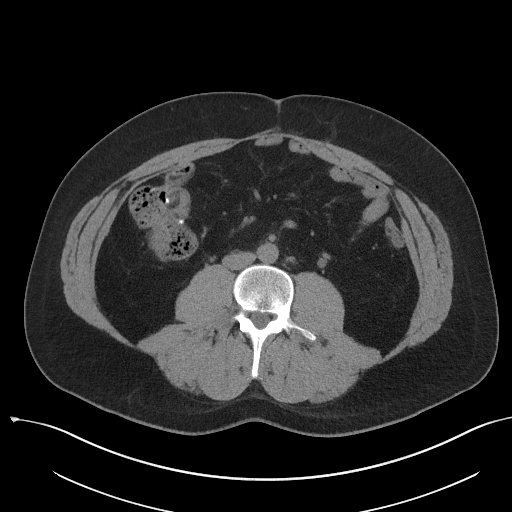
[im 79/123  soft-tissue]
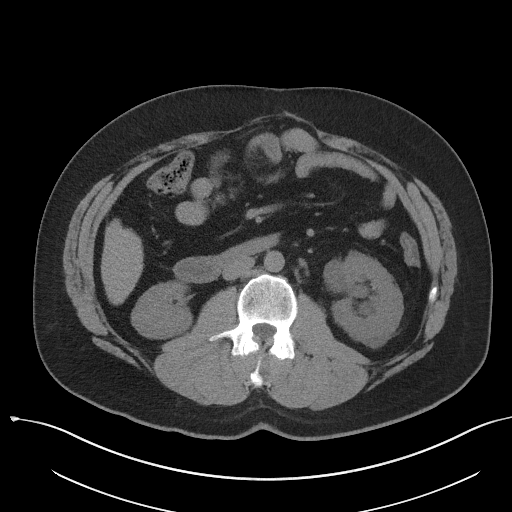
[im 79/123  bone]
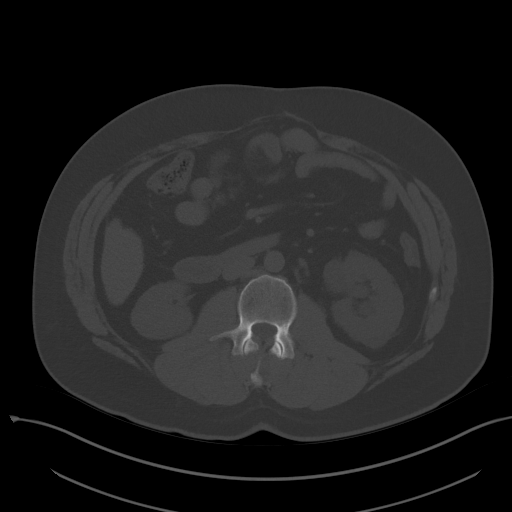
[im 88/123  soft-tissue]
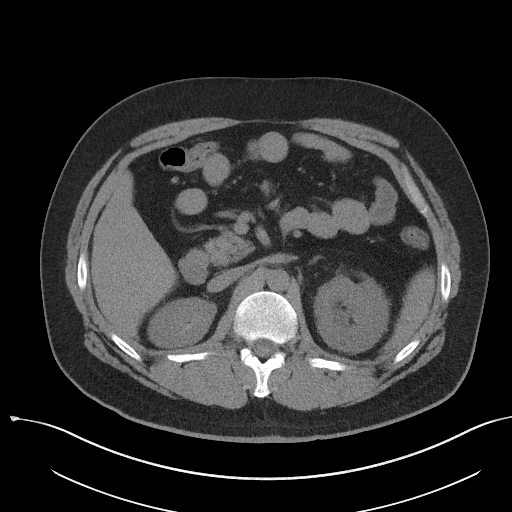
[im 98/123  soft-tissue]
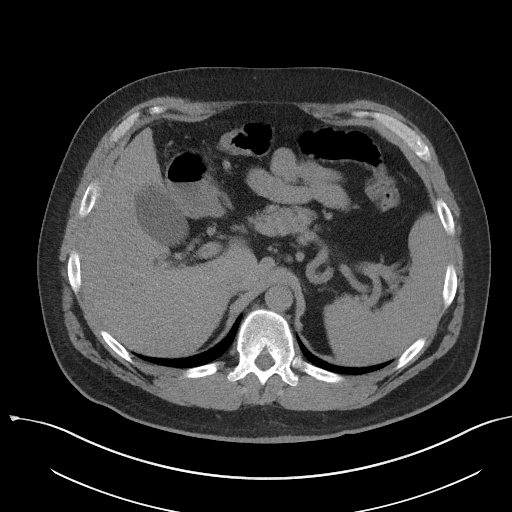
[im 108/123  soft-tissue]
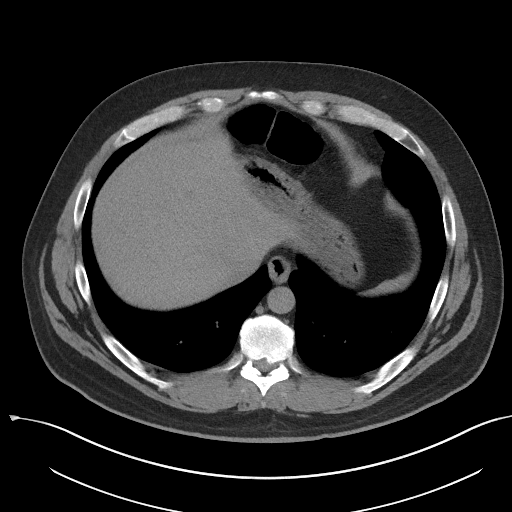
[im 118/123  soft-tissue]
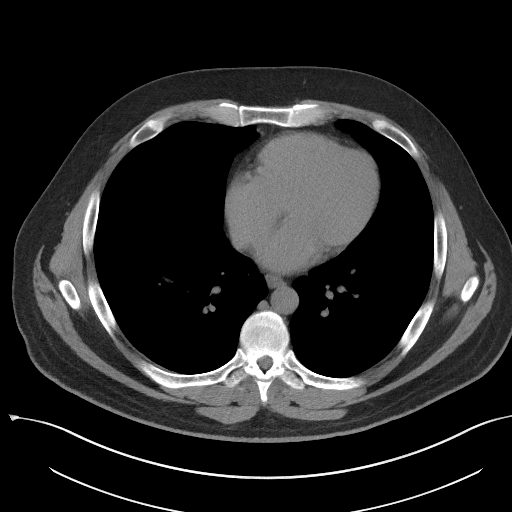

[Series 5: coronal · coronal · 0.86mm/px · 3 of 160 slices shown]
[im 54/160  soft-tissue]
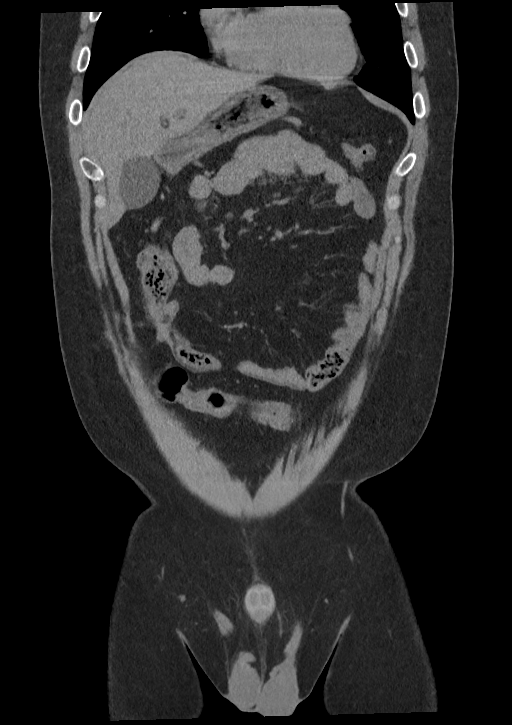
[im 71/160  soft-tissue]
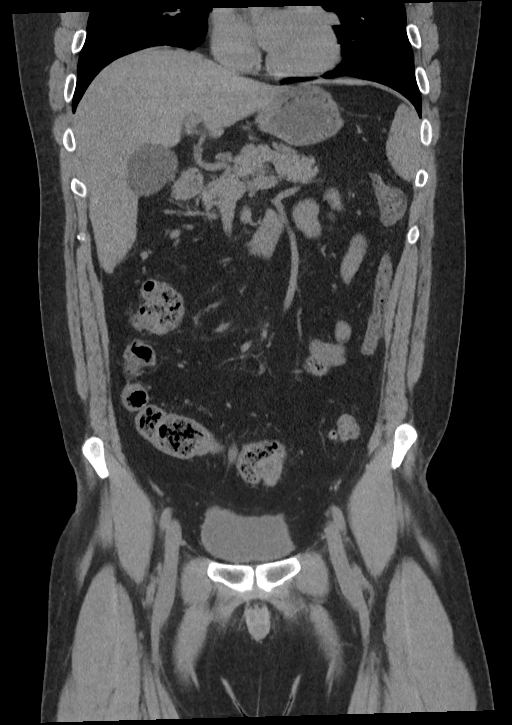
[im 89/160  soft-tissue]
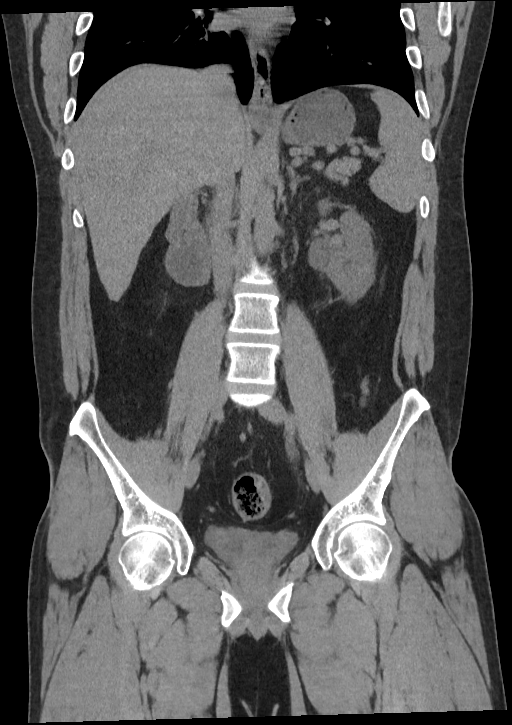

[16 of 46 positions shown; findings below may reference images not displayed]

FINDINGS: Lower chest: No acute abnormality.

Hepatobiliary: No focal liver abnormality is seen. No gallstones,
gallbladder wall thickening, or biliary dilatation.

Pancreas: Unremarkable. No pancreatic ductal dilatation or
surrounding inflammatory changes.

Spleen: Normal in size without focal abnormality.

Adrenals/Urinary Tract: Adrenal glands are unremarkable. Kidneys are
normal in size, without focal lesions. A 3 mm obstructing renal
calculus is seen within the distal left ureter. There is moderate
severity left-sided hydronephrosis and hydroureter with mild left
perinephric inflammatory fat stranding. Bladder is unremarkable.

Stomach/Bowel: Stomach is within normal limits. Surgically
anastomosed bowel is seen within the anterior aspect of the mid left
abdomen. Surgical sutures are also seen within region of the cecum.
No evidence of bowel wall thickening, distention, or inflammatory
changes.

Vascular/Lymphatic: No significant vascular findings are present. No
enlarged abdominal or pelvic lymph nodes.

Reproductive: Prostate is unremarkable.

Other: No abdominal wall hernia or abnormality. No abdominopelvic
ascites.

Musculoskeletal: No acute or significant osseous findings.
IMPRESSION: 3 mm obstructing renal calculus within the distal left ureter.
